# Patient Record
Sex: Female | Born: 1935 | Hispanic: No | Marital: Single
Health system: Southern US, Community
[De-identification: ages and names within clinical notes are randomized; demographics above are authoritative.]

## PROBLEM LIST (undated history)

## (undated) DIAGNOSIS — C3491 Malignant neoplasm of unspecified part of right bronchus or lung: Secondary | ICD-10-CM

## (undated) DIAGNOSIS — I2699 Other pulmonary embolism without acute cor pulmonale: Secondary | ICD-10-CM

## (undated) DIAGNOSIS — Z7189 Other specified counseling: Principal | ICD-10-CM

## (undated) DIAGNOSIS — C7951 Secondary malignant neoplasm of bone: Secondary | ICD-10-CM

## (undated) HISTORY — DX: Malignant neoplasm of unspecified part of right bronchus or lung: C34.91

## (undated) HISTORY — DX: Secondary malignant neoplasm of bone: C79.51

## (undated) HISTORY — DX: Other pulmonary embolism without acute cor pulmonale: I26.99

## (undated) HISTORY — DX: Other specified counseling: Z71.89

---

## 2001-07-29 ENCOUNTER — Other Ambulatory Visit: Admission: RE | Admit: 2001-07-29 | Discharge: 2001-07-29 | Payer: Self-pay | Admitting: Family Medicine

## 2016-12-14 DIAGNOSIS — J209 Acute bronchitis, unspecified: Secondary | ICD-10-CM | POA: Diagnosis not present

## 2016-12-14 DIAGNOSIS — J0100 Acute maxillary sinusitis, unspecified: Secondary | ICD-10-CM | POA: Diagnosis not present

## 2016-12-27 DIAGNOSIS — J0100 Acute maxillary sinusitis, unspecified: Secondary | ICD-10-CM | POA: Diagnosis not present

## 2017-01-07 DIAGNOSIS — J0100 Acute maxillary sinusitis, unspecified: Secondary | ICD-10-CM | POA: Diagnosis not present

## 2017-02-17 DIAGNOSIS — H2513 Age-related nuclear cataract, bilateral: Secondary | ICD-10-CM | POA: Diagnosis not present

## 2017-02-17 DIAGNOSIS — H524 Presbyopia: Secondary | ICD-10-CM | POA: Diagnosis not present

## 2017-09-14 DIAGNOSIS — Z139 Encounter for screening, unspecified: Secondary | ICD-10-CM | POA: Diagnosis not present

## 2017-09-14 DIAGNOSIS — Z9181 History of falling: Secondary | ICD-10-CM | POA: Diagnosis not present

## 2017-09-14 DIAGNOSIS — Z0000 Encounter for general adult medical examination without abnormal findings: Secondary | ICD-10-CM | POA: Diagnosis not present

## 2017-09-14 DIAGNOSIS — Z136 Encounter for screening for cardiovascular disorders: Secondary | ICD-10-CM | POA: Diagnosis not present

## 2017-09-14 DIAGNOSIS — Z1331 Encounter for screening for depression: Secondary | ICD-10-CM | POA: Diagnosis not present

## 2017-09-14 DIAGNOSIS — Z6829 Body mass index (BMI) 29.0-29.9, adult: Secondary | ICD-10-CM | POA: Diagnosis not present

## 2017-09-14 DIAGNOSIS — M8580 Other specified disorders of bone density and structure, unspecified site: Secondary | ICD-10-CM | POA: Diagnosis not present

## 2017-10-29 DIAGNOSIS — M4726 Other spondylosis with radiculopathy, lumbar region: Secondary | ICD-10-CM | POA: Diagnosis not present

## 2017-10-29 DIAGNOSIS — M5136 Other intervertebral disc degeneration, lumbar region: Secondary | ICD-10-CM | POA: Diagnosis not present

## 2017-10-29 DIAGNOSIS — M545 Low back pain: Secondary | ICD-10-CM | POA: Diagnosis not present

## 2017-11-04 DIAGNOSIS — R2689 Other abnormalities of gait and mobility: Secondary | ICD-10-CM | POA: Diagnosis not present

## 2017-11-04 DIAGNOSIS — M545 Low back pain: Secondary | ICD-10-CM | POA: Diagnosis not present

## 2017-11-04 DIAGNOSIS — M6281 Muscle weakness (generalized): Secondary | ICD-10-CM | POA: Diagnosis not present

## 2017-11-08 DIAGNOSIS — M545 Low back pain: Secondary | ICD-10-CM | POA: Diagnosis not present

## 2017-11-08 DIAGNOSIS — R2689 Other abnormalities of gait and mobility: Secondary | ICD-10-CM | POA: Diagnosis not present

## 2017-11-08 DIAGNOSIS — M6281 Muscle weakness (generalized): Secondary | ICD-10-CM | POA: Diagnosis not present

## 2017-11-11 DIAGNOSIS — R2689 Other abnormalities of gait and mobility: Secondary | ICD-10-CM | POA: Diagnosis not present

## 2017-11-11 DIAGNOSIS — M6281 Muscle weakness (generalized): Secondary | ICD-10-CM | POA: Diagnosis not present

## 2017-11-11 DIAGNOSIS — M545 Low back pain: Secondary | ICD-10-CM | POA: Diagnosis not present

## 2017-11-15 DIAGNOSIS — M545 Low back pain: Secondary | ICD-10-CM | POA: Diagnosis not present

## 2017-11-15 DIAGNOSIS — M6281 Muscle weakness (generalized): Secondary | ICD-10-CM | POA: Diagnosis not present

## 2017-11-15 DIAGNOSIS — R2689 Other abnormalities of gait and mobility: Secondary | ICD-10-CM | POA: Diagnosis not present

## 2017-11-23 DIAGNOSIS — M5441 Lumbago with sciatica, right side: Secondary | ICD-10-CM | POA: Diagnosis not present

## 2017-11-23 DIAGNOSIS — M545 Low back pain: Secondary | ICD-10-CM | POA: Diagnosis not present

## 2017-11-27 DIAGNOSIS — M48061 Spinal stenosis, lumbar region without neurogenic claudication: Secondary | ICD-10-CM | POA: Diagnosis not present

## 2017-11-27 DIAGNOSIS — M545 Low back pain: Secondary | ICD-10-CM | POA: Diagnosis not present

## 2017-11-27 DIAGNOSIS — M4807 Spinal stenosis, lumbosacral region: Secondary | ICD-10-CM | POA: Diagnosis not present

## 2017-11-27 DIAGNOSIS — R9389 Abnormal findings on diagnostic imaging of other specified body structures: Secondary | ICD-10-CM | POA: Diagnosis not present

## 2017-11-27 DIAGNOSIS — M5441 Lumbago with sciatica, right side: Secondary | ICD-10-CM | POA: Diagnosis not present

## 2017-12-02 ENCOUNTER — Ambulatory Visit (HOSPITAL_BASED_OUTPATIENT_CLINIC_OR_DEPARTMENT_OTHER)
Admission: RE | Admit: 2017-12-02 | Discharge: 2017-12-02 | Disposition: A | Discharge status: Home / Self Care | Payer: PPO | Source: Ambulatory Visit | Attending: Hematology & Oncology | Admitting: Hematology & Oncology

## 2017-12-02 ENCOUNTER — Inpatient Hospital Stay: Payer: PPO | Attending: Hematology & Oncology | Admitting: Hematology & Oncology

## 2017-12-02 ENCOUNTER — Encounter: Payer: Self-pay | Admitting: Hematology & Oncology

## 2017-12-02 ENCOUNTER — Other Ambulatory Visit: Payer: Self-pay | Admitting: Oncology

## 2017-12-02 ENCOUNTER — Inpatient Hospital Stay: Payer: PPO | Attending: Hematology & Oncology

## 2017-12-02 ENCOUNTER — Other Ambulatory Visit: Payer: Self-pay

## 2017-12-02 ENCOUNTER — Encounter (HOSPITAL_BASED_OUTPATIENT_CLINIC_OR_DEPARTMENT_OTHER): Payer: Self-pay | Admitting: Radiology

## 2017-12-02 VITALS — BP 169/73 | HR 71 | Temp 97.8°F | Resp 20 | Wt 180.5 lb

## 2017-12-02 DIAGNOSIS — L989 Disorder of the skin and subcutaneous tissue, unspecified: Secondary | ICD-10-CM | POA: Insufficient documentation

## 2017-12-02 DIAGNOSIS — Z7901 Long term (current) use of anticoagulants: Secondary | ICD-10-CM | POA: Insufficient documentation

## 2017-12-02 DIAGNOSIS — R918 Other nonspecific abnormal finding of lung field: Secondary | ICD-10-CM | POA: Insufficient documentation

## 2017-12-02 DIAGNOSIS — C801 Malignant (primary) neoplasm, unspecified: Secondary | ICD-10-CM | POA: Diagnosis not present

## 2017-12-02 DIAGNOSIS — R937 Abnormal findings on diagnostic imaging of other parts of musculoskeletal system: Secondary | ICD-10-CM | POA: Insufficient documentation

## 2017-12-02 DIAGNOSIS — C771 Secondary and unspecified malignant neoplasm of intrathoracic lymph nodes: Secondary | ICD-10-CM | POA: Diagnosis not present

## 2017-12-02 DIAGNOSIS — I2699 Other pulmonary embolism without acute cor pulmonale: Secondary | ICD-10-CM | POA: Insufficient documentation

## 2017-12-02 DIAGNOSIS — C7951 Secondary malignant neoplasm of bone: Secondary | ICD-10-CM

## 2017-12-02 DIAGNOSIS — C3490 Malignant neoplasm of unspecified part of unspecified bronchus or lung: Secondary | ICD-10-CM | POA: Diagnosis not present

## 2017-12-02 DIAGNOSIS — C3411 Malignant neoplasm of upper lobe, right bronchus or lung: Secondary | ICD-10-CM | POA: Insufficient documentation

## 2017-12-02 LAB — CBC WITH DIFFERENTIAL (CANCER CENTER ONLY)
Basophils Absolute: 0 10*3/uL (ref 0.0–0.1)
Basophils Relative: 0 %
EOS ABS: 0.3 10*3/uL (ref 0.0–0.5)
EOS PCT: 5 %
HCT: 37.5 % (ref 34.8–46.6)
Hemoglobin: 12.6 g/dL (ref 11.6–15.9)
LYMPHS ABS: 0.9 10*3/uL (ref 0.9–3.3)
LYMPHS PCT: 14 %
MCH: 29 pg (ref 26.0–34.0)
MCHC: 33.6 g/dL (ref 32.0–36.0)
MCV: 86.2 fL (ref 81.0–101.0)
MONO ABS: 0.5 10*3/uL (ref 0.1–0.9)
MONOS PCT: 7 %
Neutro Abs: 5 10*3/uL (ref 1.5–6.5)
Neutrophils Relative %: 74 %
PLATELETS: 268 10*3/uL (ref 145–400)
RBC: 4.35 MIL/uL (ref 3.70–5.32)
RDW: 12.4 % (ref 11.1–15.7)
WBC Count: 6.8 10*3/uL (ref 3.9–10.0)

## 2017-12-02 LAB — SAVE SMEAR

## 2017-12-02 LAB — TECHNOLOGIST SMEAR REVIEW

## 2017-12-02 LAB — CMP (CANCER CENTER ONLY)
ALT: 21 U/L (ref 10–47)
ANION GAP: 7 (ref 5–15)
AST: 23 U/L (ref 11–38)
Albumin: 4 g/dL (ref 3.5–5.0)
Alkaline Phosphatase: 76 U/L (ref 26–84)
BUN: 8 mg/dL (ref 7–22)
CHLORIDE: 98 mmol/L (ref 98–108)
CO2: 30 mmol/L (ref 18–33)
Calcium: 10.5 mg/dL — ABNORMAL HIGH (ref 8.0–10.3)
Creatinine: 0.7 mg/dL (ref 0.60–1.20)
GLUCOSE: 102 mg/dL (ref 73–118)
POTASSIUM: 4 mmol/L (ref 3.3–4.7)
Sodium: 135 mmol/L (ref 128–145)
TOTAL PROTEIN: 7.8 g/dL (ref 6.4–8.1)
Total Bilirubin: 0.6 mg/dL (ref 0.2–1.6)

## 2017-12-02 MED ORDER — IOPAMIDOL (ISOVUE-300) INJECTION 61%
100.0000 mL | Freq: Once | INTRAVENOUS | Status: AC | PRN
  Administered 2017-12-02: 100 mL via INTRAVENOUS

## 2017-12-02 MED ORDER — TRAMADOL HCL 50 MG PO TABS: 50.0000 mg | ORAL_TABLET | Freq: Four times a day (QID) | ORAL | 0 refills | Status: DC | PRN

## 2017-12-02 NOTE — Progress Notes (Signed)
I was contacted by radiology regarding the CT from today.  She appears to have primary lung cancer.  Acute pulmonary emboli were noted in the right lung.  I contacted Robin Sullivan.  She will go to the [facility] emergency room to receive a dose of Lovenox.  She will contact Dr. Marin Olp in the a.m. on 12/03/2017 for further instructions.  I spoke with Dr. Graylon Good at the [facility] emergency room.  He agrees to evaluate Robin Sullivan and begin anticoagulation therapy.

## 2017-12-02 NOTE — Progress Notes (Signed)
Referral MD  Reason for Referral: L5 infiltrative mass with right radicular pain  Chief Complaint  Patient presents with  . New Patient (Initial Visit)  : I had lower back pain for 2 months that is getting worse.  HPI: Robin Sullivan is a very nice 84 year old white female.  She certainly looks a lot younger.  She is a remote history of smoking.  She says she started at 84 years old and stopped when she was 84 years old.  She has never had surgery.  She is not sure when her last mammogram was.  She comes in with her daughter.  Her daughter says her last colonoscopy was about 12 years ago.  She said that she is always had back discomfort.  She says this happened when she was a child and had 5 brothers that she played with.  For the past couple months, her pain got worse in her lower back.  She denies any trauma.  She is had no change in bowel or bladder habits.  There is been no incontinence.  She had some pain down the right leg.  She saw Dr. Rip Harbour of [facility].  As always, he initiated a very thorough workup.  An MRI was subsequently done.  This was done on November 27, 2017.  Surprisingly, this showed masslike infiltration on the right at L5 with extension into the pedicle and posterior elements.  There is extraosseous extension of tumor with some encroachment of the right neural foramen at L5-S1.  Also noted was an area of infiltration on the right at L1 that measured 1.3 x 0.9 x 1.7 cm.  Everything else looked okay on the MRI.  Of note, the extra osseous component at L5 measures 4.5 x 3.3 cm.  She was then referred to the [facility] for an evaluation.  She is gained weight because she has not been able to exercise.  She has had no dysphasia.  She is had no cough.  She has had no bleeding.  She is had no fever or sweats.  He has had no palpable lymph glands.  Overall, I would say that her performance status is ECOG 1.  She has not had any blurred  vision.  She has had no nausea or vomiting.  She has had no swelling in her legs.  She is taking meloxicam for pain.  I am a little worried about her taking this with regard to her renal function and possible gastric irritation.  I will send in a prescription for tramadol (50-100 mg p.o. every 6 hours as needed)   No past medical history on file.:     Current Outpatient Medications:  .  meloxicam (MOBIC) 15 MG tablet, Take 15 mg by mouth daily., Disp: , Rfl:  .  traMADol (ULTRAM) 50 MG tablet, Take 1 tablet (50 mg total) by mouth every 6 (six) hours as needed., Disp: 120 tablet, Rfl: 0:  :  No Known Allergies:  No family history on file.:  Social History   Socioeconomic History  . Marital status: Unknown    Spouse name: Not on file  . Number of children: Not on file  . Years of education: Not on file  . Highest education level: Not on file  Social Needs  . Financial resource strain: Not on file  . Food insecurity - worry: Not on file  . Food insecurity - inability: Not on file  . Transportation needs - medical: Not on file  . Transportation  needs - non-medical: Not on file  Occupational History  . Not on file  Tobacco Use  . Smoking status: Not on file  Substance and Sexual Activity  . Alcohol use: Not on file  . Drug use: Not on file  . Sexual activity: Not on file  Other Topics Concern  . Not on file  Social History Narrative  . Not on file  :  Review of Systems  Constitutional: Negative.   HENT: Negative.   Eyes: Negative.   Respiratory: Negative.   Cardiovascular: Negative.   Gastrointestinal: Negative.   Genitourinary: Negative.   Musculoskeletal: Positive for back pain.  Skin: Negative.   Neurological: Negative.   Endo/Heme/Allergies: Negative.   Psychiatric/Behavioral: Negative.      Exam: Well-developed well-nourished white female in no obvious distress.  Vital signs show a temperature of 97.8.  Pulse 71.  Blood pressure 169/73.  Weight is 181  pounds.  Head neck exam shows no ocular or oral lesions.  She has no palpable cervical or supraclavicular lymph nodes.  Lungs are clear bilaterally.  Cardiac exam regular rate and rhythm with no murmurs, rubs or bruits.  Axillary exam shows no bilateral axillary adenopathy.  Abdomen is soft.  She is mildly obese.  She has good bowel sounds.  There is no fluid wave.  There is no palpable liver or spleen tip.  Back exam shows no tenderness over the spine.  She has some slight discomfort with percussion at the lumbosacral spine.  No paravertebral muscle spasms were noted.  Extremities shows no clubbing, cyanosis or edema.  She has good range of motion of her joints.  Neurological exam shows no focal neurological deficits. @IPVITALS @   Recent Labs    12/02/17 1324  WBC 6.8  HCT 37.5  PLT 268   Recent Labs    12/02/17 1324  NA 135  K 4.0  CL 98  CO2 30  GLUCOSE 102  BUN 8  CREATININE 0.70  CALCIUM 10.5*    Blood smear review: None  Pathology: None    Assessment and Plan: Robin Sullivan is a very nice 84 year old white female.  She has this mass at L5.  I cannot find anything that is pointing to a primary..  She does have a suspicious skin lesion on her lower back.  It is in the center of her lower back.  Given that she has a remote history of tobacco use,  I think it is worthwhile to get a CT scan of her chest.  I also want to get a CT of her abdomen and pelvis.  May be, we can find a primary in this manner.  She clearly will need a PET scan.  She also will need a biopsy.  It sounds like this extraosseous mass down at L5 should be approachable.  I also worry about the possibility of kidney cancer.  This certainly could present with a metastasis.  Once we get a tissue diagnosis, I will then involve radiation oncology.  Since she lives in [place], we will have her treated close to home.  I spent about an hour with she and her daughter.  I reviewed the MRI that she had done.  I  reviewed the labs that we had gotten back.  She understands quite well what our plan is.  I forgot to mention that she will need Xgeva once we get a tissue diagnosis.  I suspect that we probably will get her back in a couple weeks.  By then,  we should know what we are dealing with, and how much is present.  I did not yet talked to her about prognosis or goals of care.  We really need to get a handle on the etiology of this malignancy.  I answered all their questions.  I spent over 50% of the 1 hour face-to-face with the patient and her daughter.

## 2017-12-03 ENCOUNTER — Inpatient Hospital Stay (HOSPITAL_BASED_OUTPATIENT_CLINIC_OR_DEPARTMENT_OTHER): Payer: PPO | Admitting: Hematology & Oncology

## 2017-12-03 ENCOUNTER — Other Ambulatory Visit: Payer: Self-pay | Admitting: *Deleted

## 2017-12-03 VITALS — BP 141/59 | HR 66 | Temp 97.9°F | Resp 16 | Wt 180.1 lb

## 2017-12-03 DIAGNOSIS — I2692 Saddle embolus of pulmonary artery without acute cor pulmonale: Secondary | ICD-10-CM

## 2017-12-03 DIAGNOSIS — Z7901 Long term (current) use of anticoagulants: Secondary | ICD-10-CM

## 2017-12-03 DIAGNOSIS — C7951 Secondary malignant neoplasm of bone: Secondary | ICD-10-CM

## 2017-12-03 DIAGNOSIS — C3411 Malignant neoplasm of upper lobe, right bronchus or lung: Secondary | ICD-10-CM | POA: Diagnosis not present

## 2017-12-03 DIAGNOSIS — I2699 Other pulmonary embolism without acute cor pulmonale: Secondary | ICD-10-CM

## 2017-12-03 LAB — IGG, IGA, IGM
IGA: 139 mg/dL (ref 64–422)
IGM (IMMUNOGLOBULIN M), SRM: 68 mg/dL (ref 26–217)
IgG (Immunoglobin G), Serum: 1029 mg/dL (ref 700–1600)

## 2017-12-03 LAB — KAPPA/LAMBDA LIGHT CHAINS
KAPPA, LAMDA LIGHT CHAIN RATIO: 0.98 (ref 0.26–1.65)
Kappa free light chain: 8.2 mg/L (ref 3.3–19.4)
LAMDA FREE LIGHT CHAINS: 8.4 mg/L (ref 5.7–26.3)

## 2017-12-03 LAB — CEA (IN HOUSE-CHCC): CEA (CHCC-In House): 1.2 ng/mL (ref 0.00–5.00)

## 2017-12-03 LAB — CANCER ANTIGEN 27.29: CAN 27.29: 22.9 U/mL (ref 0.0–38.6)

## 2017-12-03 MED ORDER — RIVAROXABAN (XARELTO) VTE STARTER PACK (15 & 20 MG): ORAL_TABLET | ORAL | 0 refills | Status: DC

## 2017-12-03 NOTE — Progress Notes (Signed)
Hematology and Oncology Follow Up Visit  Robin Sullivan [id number] [birth year] 84 y.o. 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 daily-start on 12/03/2017     Interim History:  Robin Sullivan is back for a visit.  Unfortunately, we did a CT scan on her yesterday.  This was done to see if we can find out where this L5 lesion started.  We did find out where it started.  She has a large mass in the right upper lung.  This is centrally located.  She has bilateral pulmonary nodules.  However, we also found out that she had a pulmonary embolism.  She was seen at the ER at [facility].  She got a dose of Lovenox.  She now is coming back to see me so we can get her on formal anticoagulation.  On the CT scan, there is a 4.5 x 3.3 cm medial right upper lobe mass.  It invades the mediastinum with extension to the surface of the T5 vertebral body.  There are 8 pulmonary nodules.  Largest measures 12 mm in the right lower lobe.  She has some adenopathy in the right hilum.  CT of the abdomen and pelvis showed a 6.3 x 3.5 cm bone mass centered in the right L5 transverse process with extension into the pedicle, vertebral body and facet.  There is no adenopathy in the abdomen or pelvis.  She had no liver metastases.  I think we still need to get her biopsy of the L5 mass.  I think the lung masses to surface is located to safely biopsy.  She started tramadol yesterday.  This is making her feel a little bit better.  Overall, her performance status is ECOG 1.  Medications:  Current Outpatient Medications:  .  meloxicam (MOBIC) 15 MG tablet, Take 15 mg by mouth daily., Disp: , Rfl:  .  Rivaroxaban 15 & 20 MG TBPK, Take as directed on package: Start with one 15mg  tablet by mouth twice a day with food. On Day 22, switch to one 20mg  tablet once a day with food.,  Disp: 51 each, Rfl: 0 .  traMADol (ULTRAM) 50 MG tablet, Take 1 tablet (50 mg total) by mouth every 6 (six) hours as needed., Disp: 120 tablet, Rfl: 0  Allergies: No Known Allergies  Past Medical History, Surgical history, Social history, and Family History were reviewed and updated.  Review of Systems: Review of Systems  Constitutional: Positive for fatigue.  HENT:  Negative.   Eyes: Negative.   Respiratory: Positive for cough.   Endocrine: Negative.   Genitourinary: Negative.    Musculoskeletal: Positive for back pain.  Skin: Negative.   Neurological: Negative.   Hematological: Negative.   Psychiatric/Behavioral: Negative.     Physical Exam:  weight is 180 lb 1.3 oz (81.7 kg). Her oral temperature is 97.9 F (36.6 C). Her blood pressure is 141/59 (abnormal) and her pulse is 66. Her respiration is 16 and oxygen saturation is 100%.   Wt Readings from Last 3 Encounters:  12/03/17 180 lb 1.3 oz (81.7 kg)  12/02/17 180 lb 8 oz (81.9 kg)    Physical Exam  Constitutional: She is oriented to person, place, and time.  HENT:  Head: Normocephalic and atraumatic.  Mouth/Throat: Oropharynx is clear and moist.  Eyes: EOM are normal. Pupils are equal, round, and reactive to  light.  Neck: Normal range of motion.  Cardiovascular: Normal rate, regular rhythm and normal heart sounds.  Pulmonary/Chest: Effort normal and breath sounds normal.  Abdominal: Soft. Bowel sounds are normal.  Musculoskeletal: Normal range of motion. She exhibits no edema, tenderness or deformity.  Lymphadenopathy:    She has no cervical adenopathy.  Neurological: She is alert and oriented to person, place, and time.  Skin: Skin is warm and dry. No rash noted. No erythema.  Psychiatric: She has a normal mood and affect. Her behavior is normal. Judgment and thought content normal.  Vitals reviewed.    Lab Results  Component Value Date   WBC 6.8 12/02/2017   HCT 37.5 12/02/2017   MCV 86.2 12/02/2017   PLT  268 12/02/2017     Chemistry      Component Value Date/Time   NA 135 12/02/2017 1324   K 4.0 12/02/2017 1324   CL 98 12/02/2017 1324   CO2 30 12/02/2017 1324   BUN 8 12/02/2017 1324   CREATININE 0.70 12/02/2017 1324      Component Value Date/Time   CALCIUM 10.5 (H) 12/02/2017 1324   ALKPHOS 76 12/02/2017 1324   AST 23 12/02/2017 1324   ALT 21 12/02/2017 1324   BILITOT 0.6 12/02/2017 1324         Impression and Plan: Robin Sullivan is an 84 year old white female with metastatic bronchogenic carcinoma.  I am more worried right now about the pulmonary embolism.  I will send in the Xarelto starter pack to her pharmacy.  I told her how to take the Xarelto.  We still need to get the biopsy.  Hopefully this will be done next week.  Still need to get a PET scan on her.  I will have to see about getting a Doppler of her legs to see if there are any blood clots in her legs.  I spent about 35 minutes with she and her daughter.  Over 50% of the time was spent face-to-face reviewing the CT scans and going over my recommendations.  Robin Sullivan clearly understands that what she has is stage IV and that this is treatable but not curable.  As such, her goal of care is her quality of life.  She wanted to know how long I thought she had.  I told her that if she was still here a year from now, that I would be happy with this.  We will plan to get her back once we have the results and from her biopsy in her PET scan   Volanda Napoleon, MD 2/15/20192:20 PM

## 2017-12-06 LAB — PROTEIN ELECTROPHORESIS, SERUM, WITH REFLEX
A/G Ratio: 1.2 (ref 0.7–1.7)
ALPHA-2-GLOBULIN: 1 g/dL (ref 0.4–1.0)
Albumin ELP: 3.9 g/dL (ref 2.9–4.4)
Alpha-1-Globulin: 0.3 g/dL (ref 0.0–0.4)
Beta Globulin: 1.2 g/dL (ref 0.7–1.3)
GLOBULIN, TOTAL: 3.3 g/dL (ref 2.2–3.9)
Gamma Globulin: 0.9 g/dL (ref 0.4–1.8)
TOTAL PROTEIN ELP: 7.2 g/dL (ref 6.0–8.5)

## 2017-12-09 ENCOUNTER — Ambulatory Visit (HOSPITAL_COMMUNITY): Admission: RE | Admit: 2017-12-09 | Payer: PPO | Source: Ambulatory Visit

## 2017-12-10 ENCOUNTER — Encounter (HOSPITAL_COMMUNITY): Payer: Self-pay

## 2017-12-10 ENCOUNTER — Ambulatory Visit (HOSPITAL_COMMUNITY)
Admission: RE | Admit: 2017-12-10 | Discharge: 2017-12-10 | Disposition: A | Discharge status: Home / Self Care | Payer: PPO | Source: Ambulatory Visit | Attending: Hematology & Oncology | Admitting: Hematology & Oncology

## 2017-12-10 DIAGNOSIS — R918 Other nonspecific abnormal finding of lung field: Secondary | ICD-10-CM | POA: Insufficient documentation

## 2017-12-10 DIAGNOSIS — I700 Atherosclerosis of aorta: Secondary | ICD-10-CM | POA: Diagnosis not present

## 2017-12-10 DIAGNOSIS — R937 Abnormal findings on diagnostic imaging of other parts of musculoskeletal system: Secondary | ICD-10-CM | POA: Insufficient documentation

## 2017-12-10 DIAGNOSIS — I517 Cardiomegaly: Secondary | ICD-10-CM | POA: Diagnosis not present

## 2017-12-10 LAB — GLUCOSE, CAPILLARY: GLUCOSE-CAPILLARY: 93 mg/dL (ref 65–99)

## 2017-12-10 MED ORDER — FLUDEOXYGLUCOSE F - 18 (FDG) INJECTION
9.3000 | Freq: Once | INTRAVENOUS | Status: AC
  Administered 2017-12-10: 9.3 via INTRAVENOUS

## 2017-12-12 ENCOUNTER — Other Ambulatory Visit: Payer: Self-pay | Admitting: Radiology

## 2017-12-13 ENCOUNTER — Encounter (HOSPITAL_COMMUNITY): Payer: Self-pay

## 2017-12-13 ENCOUNTER — Ambulatory Visit (HOSPITAL_COMMUNITY)
Admission: RE | Admit: 2017-12-13 | Discharge: 2017-12-13 | Disposition: A | Discharge status: Home / Self Care | Payer: PPO | Source: Ambulatory Visit | Attending: Hematology & Oncology | Admitting: Hematology & Oncology

## 2017-12-13 DIAGNOSIS — M79604 Pain in right leg: Secondary | ICD-10-CM | POA: Diagnosis not present

## 2017-12-13 DIAGNOSIS — Z7901 Long term (current) use of anticoagulants: Secondary | ICD-10-CM | POA: Diagnosis not present

## 2017-12-13 DIAGNOSIS — Z86711 Personal history of pulmonary embolism: Secondary | ICD-10-CM | POA: Diagnosis not present

## 2017-12-13 DIAGNOSIS — C801 Malignant (primary) neoplasm, unspecified: Secondary | ICD-10-CM | POA: Insufficient documentation

## 2017-12-13 DIAGNOSIS — R590 Localized enlarged lymph nodes: Secondary | ICD-10-CM | POA: Diagnosis not present

## 2017-12-13 DIAGNOSIS — R222 Localized swelling, mass and lump, trunk: Secondary | ICD-10-CM | POA: Diagnosis not present

## 2017-12-13 DIAGNOSIS — M545 Low back pain: Secondary | ICD-10-CM | POA: Diagnosis not present

## 2017-12-13 DIAGNOSIS — R918 Other nonspecific abnormal finding of lung field: Secondary | ICD-10-CM | POA: Insufficient documentation

## 2017-12-13 DIAGNOSIS — Z87891 Personal history of nicotine dependence: Secondary | ICD-10-CM | POA: Insufficient documentation

## 2017-12-13 DIAGNOSIS — C7951 Secondary malignant neoplasm of bone: Secondary | ICD-10-CM | POA: Insufficient documentation

## 2017-12-13 DIAGNOSIS — R937 Abnormal findings on diagnostic imaging of other parts of musculoskeletal system: Secondary | ICD-10-CM | POA: Diagnosis present

## 2017-12-13 DIAGNOSIS — M899 Disorder of bone, unspecified: Secondary | ICD-10-CM | POA: Diagnosis not present

## 2017-12-13 LAB — PROTIME-INR
INR: 1.01
PROTHROMBIN TIME: 13.2 s (ref 11.4–15.2)

## 2017-12-13 LAB — CBC WITH DIFFERENTIAL/PLATELET
Basophils Absolute: 0 10*3/uL (ref 0.0–0.1)
Basophils Relative: 0 %
EOS ABS: 0.3 10*3/uL (ref 0.0–0.7)
Eosinophils Relative: 6 %
HCT: 32.7 % — ABNORMAL LOW (ref 36.0–46.0)
HEMOGLOBIN: 10.6 g/dL — AB (ref 12.0–15.0)
LYMPHS ABS: 0.8 10*3/uL (ref 0.7–4.0)
Lymphocytes Relative: 14 %
MCH: 27.5 pg (ref 26.0–34.0)
MCHC: 32.4 g/dL (ref 30.0–36.0)
MCV: 84.9 fL (ref 78.0–100.0)
Monocytes Absolute: 0.4 10*3/uL (ref 0.1–1.0)
Monocytes Relative: 7 %
NEUTROS PCT: 73 %
Neutro Abs: 3.9 10*3/uL (ref 1.7–7.7)
Platelets: 244 10*3/uL (ref 150–400)
RBC: 3.85 MIL/uL — AB (ref 3.87–5.11)
RDW: 12.6 % (ref 11.5–15.5)
WBC: 5.3 10*3/uL (ref 4.0–10.5)

## 2017-12-13 MED ORDER — FENTANYL CITRATE (PF) 100 MCG/2ML IJ SOLN
INTRAMUSCULAR | Status: AC | PRN
  Administered 2017-12-13: 50 ug via INTRAVENOUS
  Administered 2017-12-13: 25 ug via INTRAVENOUS

## 2017-12-13 MED ORDER — MIDAZOLAM HCL 2 MG/2ML IJ SOLN
INTRAMUSCULAR | Status: AC | PRN
  Administered 2017-12-13: 0.5 mg via INTRAVENOUS
  Administered 2017-12-13: 1 mg via INTRAVENOUS

## 2017-12-13 MED ORDER — SODIUM CHLORIDE 0.9 % IV SOLN: INTRAVENOUS | Status: DC

## 2017-12-13 MED ORDER — NALOXONE HCL 0.4 MG/ML IJ SOLN
INTRAMUSCULAR | Status: AC
  Filled 2017-12-13: qty 1

## 2017-12-13 MED ORDER — FLUMAZENIL 0.5 MG/5ML IV SOLN
INTRAVENOUS | Status: AC
  Filled 2017-12-13: qty 5

## 2017-12-13 MED ORDER — MIDAZOLAM HCL 2 MG/2ML IJ SOLN
INTRAMUSCULAR | Status: AC
  Filled 2017-12-13: qty 4

## 2017-12-13 MED ORDER — FENTANYL CITRATE (PF) 100 MCG/2ML IJ SOLN
INTRAMUSCULAR | Status: AC
  Filled 2017-12-13: qty 4

## 2017-12-13 MED ORDER — LIDOCAINE HCL 1 % IJ SOLN
INTRAMUSCULAR | Status: AC | PRN
  Administered 2017-12-13: 10 mL via INTRADERMAL

## 2017-12-13 NOTE — Procedures (Signed)
Pre procedural Dx: Right L5 paraspinal mass Post procedural Dx: Same  Technically successful CT guided biopsy of indeterminate Right L5 paraspinal mass   EBL: None.   Complications: None immediate.   Ronny Bacon, MD Pager #: [phone]

## 2017-12-13 NOTE — Consult Note (Addendum)
Chief Complaint: Patient was seen in consultation today for CT-guided L5 paraspinal mass biopsy  Referring Physician(s): Ennever,Peter R  Supervising Physician: Sandi Mariscal  Patient Status: [facility] - Out-pt  History of Present Illness: Robin Sullivan is a 84 y.o. female , prior smoker, with recent PET scan revealing hypermetabolic right upper lobe mass with hypermetabolic right paratracheal and right hilar adenopathy, hypermetabolic destructive lesion of right L5 vertebra as well as hypermetabolic nodularity in the right lower lobe lung.  Patient has also had recent right lower lobe PE, now on xarelto.  She presents today for image guided L5 paravertebral mass biopsy for further evaluation.  No past medical history on file.  Allergies: Patient has no known allergies.  Medications: Prior to Admission medications   Medication Sig Start Date End Date Taking? Authorizing Provider  meloxicam (MOBIC) 15 MG tablet Take 15 mg by mouth daily.    [provider]  Rivaroxaban 15 & 20 MG TBPK Take as directed on package: Start with one 15mg  tablet by mouth twice daily. Day 22, switch to one 20mg  tablet once daily. Take with food. 12/03/17   Volanda Napoleon, MD  traMADol (ULTRAM) 50 MG tablet Take 1 tablet (50 mg total) by mouth every 6 (six) hours as needed. 12/02/17   Volanda Napoleon, MD     No family history on file.  Social History   Socioeconomic History  . Marital status: Single    Spouse name: Not on file  . Number of children: Not on file  . Years of education: Not on file  . Highest education level: Not on file  Social Needs  . Financial resource strain: Not on file  . Food insecurity - worry: Not on file  . Food insecurity - inability: Not on file  . Transportation needs - medical: Not on file  . Transportation needs - non-medical: Not on file  Occupational History  . Not on file  Tobacco Use  . Smoking status: Not on file  Substance and Sexual Activity  .  Alcohol use: Not on file  . Drug use: Not on file  . Sexual activity: Not on file  Other Topics Concern  . Not on file  Social History Narrative  . Not on file     Review of Systems she denies fever, chest pain, dyspnea, cough, abdominal pain, nausea, vomiting or bleeding.  She does have occasional headaches, low back pain, and right leg discomfort.  Vital Signs: Blood pressure 157/81, heart rate 74, respirations 16, O2 sat 100% room air   Physical Exam awake, alert.  Chest clear to auscultation bilaterally.  Heart with regular rate and rhythm.  Abdomen soft, positive bowel sounds, nontender.  Asymmetrical enlargement of right greater than left lower extremity  Imaging: Ct Chest W Contrast  Result Date: 12/02/2017 CLINICAL DATA:  Suspected bone metastasis in the L5 lumbar spine on recent lumbar spine MRI performed for low back pain. Remote history of smoking. EXAM: CT CHEST, ABDOMEN, AND PELVIS WITH CONTRAST TECHNIQUE: Multidetector CT imaging of the chest, abdomen and pelvis was performed following the standard protocol during bolus administration of intravenous contrast. CONTRAST:  191mL ISOVUE-300 IOPAMIDOL (ISOVUE-300) INJECTION 61% COMPARISON:  11/27/2017 MRI lumbar spine. FINDINGS: CT CHEST FINDINGS Cardiovascular: Normal heart size. No significant pericardial fluid/thickening. Mildly atherosclerotic nonaneurysmal thoracic aorta. Normal caliber pulmonary arteries. There are acute/subacute pulmonary emboli within the segmental and subsegmental right lower lobe pulmonary artery branches. Mediastinum/Nodes: Subcentimeter hypodense posterior upper left thyroid lobe nodules. Unremarkable  esophagus. No axillary adenopathy. Enlarged heterogeneous 2.0 cm right paratracheal node (series 2/image 20). Enlarged 1.3 cm right hilar node (series 2/image 25). No left hilar adenopathy. Lungs/Pleura: No pneumothorax. No pleural effusion. Irregular solid 4.5 x 3.3 cm medial right upper lobe lung mass (series  2/image 18), which directly invades the mediastinum with extension to the surface of the right anterior T5 vertebral body without discrete osseous erosions. There are at least 8 scattered solid pulmonary nodules in the mid to lower right lung, largest 1.2 cm in the medial basilar right lower lobe (series 4/image 110). Ground-glass 8 mm left upper lobe pulmonary nodule (series 4/image 53). Musculoskeletal: No aggressive appearing focal osseous lesions. Mild thoracic spondylosis. CT ABDOMEN PELVIS FINDINGS Hepatobiliary: Normal liver with no liver mass. Normal gallbladder with no radiopaque cholelithiasis. No biliary ductal dilatation. Pancreas: Normal, with no mass or duct dilation. Spleen: Normal size. No mass. Adrenals/Urinary Tract: Normal adrenals. Normal kidneys with no hydronephrosis and no renal mass. Normal bladder. Stomach/Bowel: Normal non-distended stomach. Normal caliber small bowel with no small bowel wall thickening. Normal appendix. Normal large bowel with no diverticulosis, large bowel wall thickening or pericolonic fat stranding. Vascular/Lymphatic: Atherosclerotic nonaneurysmal abdominal aorta. Patent portal, splenic, hepatic and renal veins. No pathologically enlarged lymph nodes in the abdomen or pelvis. Reproductive: Grossly normal uterus.  No adnexal mass. Other: No pneumoperitoneum, ascites or focal fluid collection. Musculoskeletal: There is an expansile lytic 6.3 x 3.5 cm bone mass centered in right L5 transverse process with extension into the pedicle, right vertebral body and facet. Mild lumbar spondylosis. IMPRESSION: 1. Acute/subacute right lower lobe segmental/subsegmental pulmonary emboli. 2. Irregular 4.5 cm medial right upper lobe lung mass with mediastinal invasion, compatible with primary bronchogenic carcinoma. 3. Ipsilateral hilar and ipsilateral paratracheal nodal metastases. 4. Several solid pulmonary nodules scattered in the right lung up to 1.2 cm, probably ipsilateral  pulmonary metastases. 5. Expansile lytic bone metastasis centered in the right L5 transverse process with involvement of the posterior elements and right L5 vertebral body, as detailed on the recent lumbar spine MRI study. Critical Value/emergent results were called by telephone at the time of interpretation on 12/02/2017 at 5:51 pm to Dr. Julieanne Manson, who verbally acknowledged these results. Electronically Signed   By: Ilona Sorrel M.D.   On: 12/02/2017 17:58   Ct Abdomen Pelvis W Contrast  Result Date: 12/02/2017 CLINICAL DATA:  Suspected bone metastasis in the L5 lumbar spine on recent lumbar spine MRI performed for low back pain. Remote history of smoking. EXAM: CT CHEST, ABDOMEN, AND PELVIS WITH CONTRAST TECHNIQUE: Multidetector CT imaging of the chest, abdomen and pelvis was performed following the standard protocol during bolus administration of intravenous contrast. CONTRAST:  111mL ISOVUE-300 IOPAMIDOL (ISOVUE-300) INJECTION 61% COMPARISON:  11/27/2017 MRI lumbar spine. FINDINGS: CT CHEST FINDINGS Cardiovascular: Normal heart size. No significant pericardial fluid/thickening. Mildly atherosclerotic nonaneurysmal thoracic aorta. Normal caliber pulmonary arteries. There are acute/subacute pulmonary emboli within the segmental and subsegmental right lower lobe pulmonary artery branches. Mediastinum/Nodes: Subcentimeter hypodense posterior upper left thyroid lobe nodules. Unremarkable esophagus. No axillary adenopathy. Enlarged heterogeneous 2.0 cm right paratracheal node (series 2/image 20). Enlarged 1.3 cm right hilar node (series 2/image 25). No left hilar adenopathy. Lungs/Pleura: No pneumothorax. No pleural effusion. Irregular solid 4.5 x 3.3 cm medial right upper lobe lung mass (series 2/image 18), which directly invades the mediastinum with extension to the surface of the right anterior T5 vertebral body without discrete osseous erosions. There are at least 8 scattered solid pulmonary nodules in  the  mid to lower right lung, largest 1.2 cm in the medial basilar right lower lobe (series 4/image 110). Ground-glass 8 mm left upper lobe pulmonary nodule (series 4/image 53). Musculoskeletal: No aggressive appearing focal osseous lesions. Mild thoracic spondylosis. CT ABDOMEN PELVIS FINDINGS Hepatobiliary: Normal liver with no liver mass. Normal gallbladder with no radiopaque cholelithiasis. No biliary ductal dilatation. Pancreas: Normal, with no mass or duct dilation. Spleen: Normal size. No mass. Adrenals/Urinary Tract: Normal adrenals. Normal kidneys with no hydronephrosis and no renal mass. Normal bladder. Stomach/Bowel: Normal non-distended stomach. Normal caliber small bowel with no small bowel wall thickening. Normal appendix. Normal large bowel with no diverticulosis, large bowel wall thickening or pericolonic fat stranding. Vascular/Lymphatic: Atherosclerotic nonaneurysmal abdominal aorta. Patent portal, splenic, hepatic and renal veins. No pathologically enlarged lymph nodes in the abdomen or pelvis. Reproductive: Grossly normal uterus.  No adnexal mass. Other: No pneumoperitoneum, ascites or focal fluid collection. Musculoskeletal: There is an expansile lytic 6.3 x 3.5 cm bone mass centered in right L5 transverse process with extension into the pedicle, right vertebral body and facet. Mild lumbar spondylosis. IMPRESSION: 1. Acute/subacute right lower lobe segmental/subsegmental pulmonary emboli. 2. Irregular 4.5 cm medial right upper lobe lung mass with mediastinal invasion, compatible with primary bronchogenic carcinoma. 3. Ipsilateral hilar and ipsilateral paratracheal nodal metastases. 4. Several solid pulmonary nodules scattered in the right lung up to 1.2 cm, probably ipsilateral pulmonary metastases. 5. Expansile lytic bone metastasis centered in the right L5 transverse process with involvement of the posterior elements and right L5 vertebral body, as detailed on the recent lumbar spine MRI study.  Critical Value/emergent results were called by telephone at the time of interpretation on 12/02/2017 at 5:51 pm to Dr. Julieanne Manson, who verbally acknowledged these results. Electronically Signed   By: Ilona Sorrel M.D.   On: 12/02/2017 17:58   Nm Pet Image Initial (pi) Skull Base To Thigh  Result Date: 12/10/2017 CLINICAL DATA:  Initial treatment strategy for right upper lobe mass. EXAM: NUCLEAR MEDICINE PET SKULL BASE TO THIGH TECHNIQUE: 9.3 mCi F-18 FDG was injected intravenously. Full-ring PET imaging was performed from the skull base to thigh after the radiotracer. CT data was obtained and used for attenuation correction and anatomic localization. FASTING BLOOD GLUCOSE:  Value: 93 mg/dl COMPARISON:  CT scan 12/02/2017 FINDINGS: NECK No hypermetabolic lymph nodes in the neck. CHEST The right upper lobe mass measuring 4.7 by 3.3 cm on image 66/5 has a maximum SUV of 29.3 The adjacent right lower paratracheal node measuring 2.3 cm in short axis on image 67/4 has a maximum SUV of 21.4. A right hilar node measuring 1.3 cm in short axis on image 75/4 has a maximum SUV of 21.6. A right lower lobe nodule measuring 1.1 by 1.2 cm on image 92/4 has a maximum SUV of 4.6. A right lower paraesophageal lymph node near the hiatus measures 0.7 cm in short axis on image 91/4 and has a maximum standard uptake value of 4.2. Background mediastinal blood pool activity 2.9. Mild cardiomegaly. Mild atherosclerotic calcification of the aortic arch. The 7 mm in diameter right lower lobe pulmonary nodule on image 32/8 has a maximum SUV of 1.7. The other small nodules are not appreciably hypermetabolic. ABDOMEN/PELVIS No abnormal hypermetabolic activity within the liver, pancreas, adrenal glands, or spleen. No hypermetabolic lymph nodes in the abdomen or pelvis. Aortoiliac atherosclerotic vascular disease. Accentuated activity in the lower rectum with maximum SUV 9.8, without CT correlate, this may be physiologic but is technically  nonspecific.  SKELETON The destructive right eccentric L5 vertebral body mass involves the transverse process, and pedicle, with considerable extension into the surrounding paraspinal soft tissues and likely the L4-5 and L5-S1 neural foramina. The region of hypermetabolic activity has maximum SUV of 26.3 and measures approximately 7.1 by 3.9 cm. There is no abnormal marrow activity at L1 or elsewhere in the visualized skeleton. IMPRESSION: 1. Highly hypermetabolic right upper lobe mass with hypermetabolic right paratracheal and right hilar adenopathy; a highly hypermetabolic destructive lesion of the right L5 vertebra; and hypermetabolic nodularity in the right lower lobe overall compatible with metastatic right upper lobe lung cancer. 2. Accentuated activity in the lower rectum is probably physiologic given the lack of CT correlate, but may warrant digital rectal exam or alternative screening method such as Cologuard. 3.  Aortic Atherosclerosis (ICD10-I70.0). 4. Mild cardiomegaly. Electronically Signed   By: Van Clines M.D.   On: 12/10/2017 19:26    Labs:  CBC: Recent Labs    12/02/17 1324  WBC 6.8  HCT 37.5  PLT 268    COAGS: No results for input(s): INR, APTT in the last 8760 hours.  BMP: Recent Labs    12/02/17 1324  NA 135  K 4.0  CL 98  CO2 30  GLUCOSE 102  BUN 8  CALCIUM 10.5*  CREATININE 0.70    LIVER FUNCTION TESTS: Recent Labs    12/02/17 1324  BILITOT 0.6  AST 23  ALT 21  ALKPHOS 76  PROT 7.8  ALBUMIN 4.0    TUMOR MARKERS: No results for input(s): AFPTM, CEA, CA199, CHROMGRNA in the last 8760 hours.  Assessment and Plan: 84 y.o. female , prior smoker, with recent PET scan revealing hypermetabolic right upper lobe mass with hypermetabolic right paratracheal and right hilar adenopathy, hypermetabolic destructive lesion of right L5 vertebra as well as hypermetabolic nodularity in the right lower lobe lung.  Patient has also had recent right lower lobe PE,  now on xarelto.  She presents today for image guided L5 paravertebral mass biopsy for further evaluation.Risks and benefits discussed with the patient including, but not limited to bleeding, infection, damage to adjacent structures or low yield requiring additional tests. LE venous dopplers also to be ordered soon.  All of the patient's questions were answered, patient is agreeable to proceed. Consent signed and in chart.    Thank you for this interesting consult.  I greatly enjoyed meeting Robin Sullivan [facility] and look forward to participating in their care.  A copy of this report was sent to the requesting provider on this date.  Electronically Signed: D. Rowe Robert, PA-C 12/13/2017, 8:57 AM   I spent a total of 25 minutes  in face to face in clinical consultation, greater than 50% of which was counseling/coordinating care for CT-guided L5 paraspinal mass biopsy

## 2017-12-13 NOTE — Discharge Instructions (Signed)

## 2017-12-17 ENCOUNTER — Ambulatory Visit (HOSPITAL_BASED_OUTPATIENT_CLINIC_OR_DEPARTMENT_OTHER)
Admission: RE | Admit: 2017-12-17 | Discharge: 2017-12-17 | Disposition: A | Discharge status: Home / Self Care | Payer: PPO | Source: Ambulatory Visit | Attending: Hematology & Oncology | Admitting: Hematology & Oncology

## 2017-12-17 ENCOUNTER — Encounter: Payer: Self-pay | Admitting: Hematology & Oncology

## 2017-12-17 ENCOUNTER — Inpatient Hospital Stay: Payer: PPO | Attending: Hematology & Oncology | Admitting: Hematology & Oncology

## 2017-12-17 VITALS — BP 139/65 | HR 70 | Temp 98.0°F | Resp 19

## 2017-12-17 DIAGNOSIS — M549 Dorsalgia, unspecified: Secondary | ICD-10-CM | POA: Diagnosis not present

## 2017-12-17 DIAGNOSIS — M79604 Pain in right leg: Secondary | ICD-10-CM

## 2017-12-17 DIAGNOSIS — Z7901 Long term (current) use of anticoagulants: Secondary | ICD-10-CM | POA: Insufficient documentation

## 2017-12-17 DIAGNOSIS — I2699 Other pulmonary embolism without acute cor pulmonale: Secondary | ICD-10-CM | POA: Diagnosis not present

## 2017-12-17 DIAGNOSIS — M7989 Other specified soft tissue disorders: Secondary | ICD-10-CM | POA: Diagnosis not present

## 2017-12-17 DIAGNOSIS — C3411 Malignant neoplasm of upper lobe, right bronchus or lung: Secondary | ICD-10-CM

## 2017-12-17 DIAGNOSIS — C787 Secondary malignant neoplasm of liver and intrahepatic bile duct: Secondary | ICD-10-CM | POA: Diagnosis not present

## 2017-12-17 DIAGNOSIS — I2782 Chronic pulmonary embolism: Secondary | ICD-10-CM

## 2017-12-17 DIAGNOSIS — C3491 Malignant neoplasm of unspecified part of right bronchus or lung: Secondary | ICD-10-CM | POA: Insufficient documentation

## 2017-12-17 DIAGNOSIS — I2692 Saddle embolus of pulmonary artery without acute cor pulmonale: Secondary | ICD-10-CM | POA: Diagnosis not present

## 2017-12-17 DIAGNOSIS — C7951 Secondary malignant neoplasm of bone: Secondary | ICD-10-CM | POA: Insufficient documentation

## 2017-12-17 HISTORY — DX: Secondary malignant neoplasm of bone: C79.51

## 2017-12-17 HISTORY — DX: Malignant neoplasm of unspecified part of right bronchus or lung: C34.91

## 2017-12-17 HISTORY — DX: Other pulmonary embolism without acute cor pulmonale: I26.99

## 2017-12-17 MED ORDER — OXYCODONE HCL 5 MG PO TABS: ORAL_TABLET | ORAL | 0 refills | Status: DC

## 2017-12-17 MED ORDER — DEXAMETHASONE 4 MG PO TABS: ORAL_TABLET | ORAL | 3 refills | Status: AC

## 2017-12-17 MED ORDER — PANTOPRAZOLE SODIUM 40 MG PO TBEC: 40.0000 mg | DELAYED_RELEASE_TABLET | Freq: Two times a day (BID) | ORAL | 4 refills | Status: AC

## 2017-12-17 NOTE — Progress Notes (Signed)
Hematology and Oncology Follow Up Visit  Robin Sullivan [id number] [birth year] 84 y.o. 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 daily-start on 12/03/2017     Interim History:  Robin Sullivan is back for a visit.  We got the results back from her biopsy.  This was done on December 13, 2017.  This was a right L5 paraspinal mass.  The pathology report ([id number]) showed metastatic adenocarcinoma.  This was consistent with a lung primary.  We are awaiting the Foundation One results to see if there is a genetic mutation that we can target.  We did a PET scan on her.  A PET scan was done on February 28.  The PET scan showed hypermetabolic tumor in the right upper lobe.  This measured 4.7 x 3.3 cm.  It had an SUV of 29.  She had a right lower paratracheal lymph node measuring 2.3 cm.  It had an SUV of 21.4.  A right hilar lymph node measured 1.3 cm.  This is an SUV of 21.6.  She had a very highly metabolic tumor at L5.  This was a destructive process involving the L5 vertebral body, transverse process, pedicle.  This had an SUV of 26.3.  It measured 7.1 x 3.9 cm.  She also had a pulmonary embolism.  This was found during our evaluation.  She had a Doppler of her legs today.  The Dopplers were negative.  She had already been on Xarelto.  She is complaining more in the way of back and right leg pain.  I am sure that this is likely from her tumor.  I have spoken with radiation oncology down at [facility].  They will see Robin Sullivan early next week.  I put Robin Sullivan on oxycodone and Decadron.  I also gave her some Protonix.  She comes in with her daughter.  I told him that we have to await the results of the genetic analysis to see how we can treat her systemically.  Since she is symptomatic from the L5 tumor, this is where we need to focus our attention  right now.  She has had no bleeding.  There is no chest wall pain.  She has had no headache.  Overall, I say performance status is ECOG 1.   Medications:  Current Outpatient Medications:  .  dexamethasone (DECADRON) 4 MG tablet, Take 4 tablets today with food, then take 1 tablet 3 times a day with food, Disp: 120 tablet, Rfl: 3 .  oxyCODONE (OXY IR/ROXICODONE) 5 MG immediate release tablet, Take 1-2 pills, if needed, every 4 hours for pain, Disp: 90 tablet, Rfl: 0 .  pantoprazole (PROTONIX) 40 MG tablet, Take 1 tablet (40 mg total) by mouth 2 (two) times daily., Disp: 60 tablet, Rfl: 4 .  Rivaroxaban 15 & 20 MG TBPK, Take as directed on package: Start with one 15mg  tablet by mouth twice daily. Day 22, switch to one 20mg  tablet once daily. Take with food., Disp: 24 each, Rfl: 0  Allergies: No Known Allergies  Past Medical History, Surgical history, Social history, and Family History were reviewed and updated.  Review of Systems: Review of Systems  Constitutional: Positive for fatigue.  HENT:  Negative.   Eyes: Negative.   Respiratory: Positive for cough.   Endocrine: Negative.   Genitourinary: Negative.    Musculoskeletal: Positive  for back pain.  Skin: Negative.   Neurological: Negative.   Hematological: Negative.   Psychiatric/Behavioral: Negative.     Physical Exam:  oral temperature is 98 F (36.7 C). Her blood pressure is 139/65 and her pulse is 70. Her respiration is 19 and oxygen saturation is 96%.   Wt Readings from Last 3 Encounters:  12/03/17 180 lb 1.3 oz (81.7 kg)  12/02/17 180 lb 8 oz (81.9 kg)    Physical Exam  Constitutional: She is oriented to person, place, and time.  HENT:  Head: Normocephalic and atraumatic.  Mouth/Throat: Oropharynx is clear and moist.  Eyes: EOM are normal. Pupils are equal, round, and reactive to light.  Neck: Normal range of motion.  Cardiovascular: Normal rate, regular rhythm and normal heart sounds.  Pulmonary/Chest: Effort  normal and breath sounds normal.  Abdominal: Soft. Bowel sounds are normal.  Musculoskeletal: Normal range of motion. She exhibits no edema, tenderness or deformity.  Lymphadenopathy:    She has no cervical adenopathy.  Neurological: She is alert and oriented to person, place, and time.  Skin: Skin is warm and dry. No rash noted. No erythema.  Psychiatric: She has a normal mood and affect. Her behavior is normal. Judgment and thought content normal.  Vitals reviewed.    Lab Results  Component Value Date   WBC 5.3 12/13/2017   HGB 10.6 (L) 12/13/2017   HCT 32.7 (L) 12/13/2017   MCV 84.9 12/13/2017   PLT 244 12/13/2017     Chemistry      Component Value Date/Time   NA 135 12/02/2017 1324   K 4.0 12/02/2017 1324   CL 98 12/02/2017 1324   CO2 30 12/02/2017 1324   BUN 8 12/02/2017 1324   CREATININE 0.70 12/02/2017 1324      Component Value Date/Time   CALCIUM 10.5 (H) 12/02/2017 1324   ALKPHOS 76 12/02/2017 1324   AST 23 12/02/2017 1324   ALT 21 12/02/2017 1324   BILITOT 0.6 12/02/2017 1324         Impression and Plan: Robin Sullivan is an 84 year old white female with metastatic bronchogenic carcinoma.  This is an adenocarcinoma.  Hopefully, she will have a genetic mutation that we can target.  If not, we will have to consider systemic therapy with chemotherapy/immunotherapy.  Hopefully, the Foundation One analysis will be back next week.  I would like to think that the radiation therapy will help with the back pain.  She will continue the Xarelto.  I think she will be on Xarelto for long-term as far as I can tell right now.  She will definitely need Xgeva.  I will arrange for this to start when we start her systemic therapy.  I spent about 45 minutes with she and her daughter today.  Over 50% of the time was spent face-to-face discussing her pathology results, going over her PET scan results, and explaining my recommendations for systemic therapy and local therapy for  her back pain and right radicular leg pain.  We will plan to get her back in about 2 or 3 weeks.  Volanda Napoleon, MD 3/1/20195:29 PM

## 2017-12-21 DIAGNOSIS — C3490 Malignant neoplasm of unspecified part of unspecified bronchus or lung: Secondary | ICD-10-CM | POA: Diagnosis not present

## 2017-12-21 DIAGNOSIS — C7951 Secondary malignant neoplasm of bone: Secondary | ICD-10-CM | POA: Diagnosis not present

## 2017-12-22 DIAGNOSIS — C7951 Secondary malignant neoplasm of bone: Secondary | ICD-10-CM | POA: Diagnosis not present

## 2017-12-22 DIAGNOSIS — C801 Malignant (primary) neoplasm, unspecified: Secondary | ICD-10-CM | POA: Diagnosis not present

## 2017-12-22 DIAGNOSIS — Z510 Encounter for antineoplastic radiation therapy: Secondary | ICD-10-CM | POA: Diagnosis not present

## 2017-12-23 DIAGNOSIS — C3490 Malignant neoplasm of unspecified part of unspecified bronchus or lung: Secondary | ICD-10-CM | POA: Diagnosis not present

## 2017-12-23 DIAGNOSIS — C7951 Secondary malignant neoplasm of bone: Secondary | ICD-10-CM | POA: Diagnosis not present

## 2017-12-23 DIAGNOSIS — Z510 Encounter for antineoplastic radiation therapy: Secondary | ICD-10-CM | POA: Diagnosis not present

## 2017-12-24 DIAGNOSIS — C7951 Secondary malignant neoplasm of bone: Secondary | ICD-10-CM | POA: Diagnosis not present

## 2017-12-27 DIAGNOSIS — C7951 Secondary malignant neoplasm of bone: Secondary | ICD-10-CM | POA: Diagnosis not present

## 2017-12-28 DIAGNOSIS — C7951 Secondary malignant neoplasm of bone: Secondary | ICD-10-CM | POA: Diagnosis not present

## 2017-12-29 ENCOUNTER — Other Ambulatory Visit: Payer: Self-pay | Admitting: *Deleted

## 2017-12-29 DIAGNOSIS — I2782 Chronic pulmonary embolism: Secondary | ICD-10-CM

## 2017-12-29 DIAGNOSIS — C7951 Secondary malignant neoplasm of bone: Secondary | ICD-10-CM | POA: Diagnosis not present

## 2017-12-29 DIAGNOSIS — C3491 Malignant neoplasm of unspecified part of right bronchus or lung: Secondary | ICD-10-CM | POA: Diagnosis not present

## 2017-12-29 MED ORDER — RIVAROXABAN 20 MG PO TABS: 20.0000 mg | ORAL_TABLET | Freq: Every day | ORAL | 11 refills | Status: AC

## 2017-12-30 DIAGNOSIS — C7951 Secondary malignant neoplasm of bone: Secondary | ICD-10-CM | POA: Diagnosis not present

## 2017-12-31 DIAGNOSIS — C7951 Secondary malignant neoplasm of bone: Secondary | ICD-10-CM | POA: Diagnosis not present

## 2018-01-03 DIAGNOSIS — C7951 Secondary malignant neoplasm of bone: Secondary | ICD-10-CM | POA: Diagnosis not present

## 2018-01-04 ENCOUNTER — Encounter (HOSPITAL_COMMUNITY): Payer: Self-pay | Admitting: Hematology & Oncology

## 2018-01-04 ENCOUNTER — Other Ambulatory Visit: Payer: Self-pay | Admitting: Hematology & Oncology

## 2018-01-04 DIAGNOSIS — C7951 Secondary malignant neoplasm of bone: Secondary | ICD-10-CM | POA: Diagnosis not present

## 2018-01-05 DIAGNOSIS — C7951 Secondary malignant neoplasm of bone: Secondary | ICD-10-CM | POA: Diagnosis not present

## 2018-01-06 DIAGNOSIS — C7951 Secondary malignant neoplasm of bone: Secondary | ICD-10-CM | POA: Diagnosis not present

## 2018-01-07 DIAGNOSIS — C7951 Secondary malignant neoplasm of bone: Secondary | ICD-10-CM | POA: Diagnosis not present

## 2018-01-10 ENCOUNTER — Inpatient Hospital Stay: Payer: PPO

## 2018-01-10 ENCOUNTER — Inpatient Hospital Stay (HOSPITAL_BASED_OUTPATIENT_CLINIC_OR_DEPARTMENT_OTHER): Payer: PPO | Admitting: Hematology & Oncology

## 2018-01-10 ENCOUNTER — Encounter: Payer: Self-pay | Admitting: Hematology & Oncology

## 2018-01-10 DIAGNOSIS — Z7189 Other specified counseling: Secondary | ICD-10-CM

## 2018-01-10 DIAGNOSIS — C7951 Secondary malignant neoplasm of bone: Secondary | ICD-10-CM

## 2018-01-10 DIAGNOSIS — C3411 Malignant neoplasm of upper lobe, right bronchus or lung: Secondary | ICD-10-CM

## 2018-01-10 HISTORY — DX: Other specified counseling: Z71.89

## 2018-01-10 MED ORDER — DENOSUMAB 120 MG/1.7ML ~~LOC~~ SOLN: 120.0000 mg | Freq: Once | SUBCUTANEOUS | Status: AC

## 2018-01-10 MED ORDER — DENOSUMAB 120 MG/1.7ML ~~LOC~~ SOLN
SUBCUTANEOUS | Status: AC
  Filled 2018-01-10: qty 1.7

## 2018-01-10 NOTE — Progress Notes (Unsigned)
Today's Robin Sullivan is the patient's first dose. Okay to use baseline Ca = 10.5 and SCr 0.7 from 12/04/17 per Dr. Marin Olp.

## 2018-01-10 NOTE — Progress Notes (Signed)
Patient has not been taking her Xarelto for 2 days because she noticed some vaginal spotting. NO BM to have noticed any bleeding with and NO epistaxis.

## 2018-01-10 NOTE — Progress Notes (Signed)
Hematology and Oncology Follow Up Visit  Robin Sullivan [id number] [birth year] 84 y.o. 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 daily-start on 12/03/2017  Radiation Therapy to the lower back     Interim History:  Robin Sullivan is back for a visit.  We got the results back from her biopsy.  This was done on December 13, 2017.  This was a right L5 paraspinal mass.  The pathology report ([id number]) showed metastatic adenocarcinoma.  This was consistent with a lung primary.  We did get the [facility] molecular assay back.  Unfortunately, there really was not much that we could target.  She did not have any actionable mutations.  She was microsatellite stable.  She did have in intermediate TMB.  She was positive for KRAS mutation.  She currently is getting radiation therapy.  This is helping a little bit.  She is on Xarelto.  For some reason, she stopped the Xarelto a couple days ago.  I am not sure as to why she stopped this.  I told her that she really needed to get on the Xarelto so that her blood clots would not progress.  She comes in with her son and daughter-in-law.  I talked to them at length about the systemic treatment possibilities.  As I see it, I think the only option that we have systemically is to use chemo immunotherapy.  I think this would work.  I know that she is 84 years old but she still has a good performance status.  I gave her information sheets on carboplatinum/Alimta/pembrolizumab.  I think that this combination could have a success rate of about 70%.  She knows that she is not curable.  We have talked about this before.  Our goal here is quality of life.  I want her to be able to have better quality of life so she can do activities that she enjoys and be able to enjoy her family.  I really think that we can accomplish this.  We  talked about the possibility of vertebroplasty.  I will have to talk to the interventional radiology doctors to see if they think that the spine lesions are amenable to vertebroplasty.  If she does take chemotherapy, she will need to have a Port-A-Cath.  I spent about 45 minutes with Robin Sullivan and her family.  Overall, I say performance status is ECOG 1.   Medications:  Current Outpatient Medications:  .  furosemide (LASIX) 20 MG tablet, Take 20 mg by mouth., Disp: , Rfl:  .  methadone (DOLOPHINE) 10 MG tablet, Take 10 mg by mouth every 8 (eight) hours., Disp: , Rfl:  .  dexamethasone (DECADRON) 4 MG tablet, Take 4 tablets today with food, then take 1 tablet 3 times a day with food, Disp: 120 tablet, Rfl: 3 .  pantoprazole (PROTONIX) 40 MG tablet, Take 1 tablet (40 mg total) by mouth 2 (two) times daily., Disp: 60 tablet, Rfl: 4 .  rivaroxaban (XARELTO) 20 MG TABS tablet, Take 1 tablet (20 mg total) by mouth daily with supper., Disp: 30 tablet, Rfl: 11 No current facility-administered medications for this visit.   Facility-Administered Medications Ordered in Other Visits:  .  denosumab (XGEVA) injection 120 mg, 120 mg, Subcutaneous, Once, Yusuf Yu, Rudell Cobb, MD  Allergies: No Known Allergies  Past Medical History, Surgical history, Social history, and  Family History were reviewed and updated.  Review of Systems: Review of Systems  Constitutional: Positive for fatigue.  HENT:  Negative.   Eyes: Negative.   Respiratory: Positive for cough.   Endocrine: Negative.   Genitourinary: Negative.    Musculoskeletal: Positive for back pain.  Skin: Negative.   Neurological: Negative.   Hematological: Negative.   Psychiatric/Behavioral: Negative.     Physical Exam:  oral temperature is 98.4 F (36.9 C). Her blood pressure is 142/90 (abnormal) and her pulse is 80. Her respiration is 19 and oxygen saturation is 100%.   Wt Readings from Last 3 Encounters:  12/03/17 180 lb 1.3 oz (81.7 kg)    12/02/17 180 lb 8 oz (81.9 kg)    Physical Exam  Constitutional: She is oriented to person, place, and time.  HENT:  Head: Normocephalic and atraumatic.  Mouth/Throat: Oropharynx is clear and moist.  Eyes: Pupils are equal, round, and reactive to light. EOM are normal.  Neck: Normal range of motion.  Cardiovascular: Normal rate, regular rhythm and normal heart sounds.  Pulmonary/Chest: Effort normal and breath sounds normal.  Abdominal: Soft. Bowel sounds are normal.  Musculoskeletal: Normal range of motion. She exhibits no edema, tenderness or deformity.  Lymphadenopathy:    She has no cervical adenopathy.  Neurological: She is alert and oriented to person, place, and time.  Skin: Skin is warm and dry. No rash noted. No erythema.  Psychiatric: She has a normal mood and affect. Her behavior is normal. Judgment and thought content normal.  Vitals reviewed.    Lab Results  Component Value Date   WBC 5.3 12/13/2017   HGB 10.6 (L) 12/13/2017   HCT 32.7 (L) 12/13/2017   MCV 84.9 12/13/2017   PLT 244 12/13/2017     Chemistry      Component Value Date/Time   NA 135 12/02/2017 1324   K 4.0 12/02/2017 1324   CL 98 12/02/2017 1324   CO2 30 12/02/2017 1324   BUN 8 12/02/2017 1324   CREATININE 0.70 12/02/2017 1324      Component Value Date/Time   CALCIUM 10.5 (H) 12/02/2017 1324   ALKPHOS 76 12/02/2017 1324   AST 23 12/02/2017 1324   ALT 21 12/02/2017 1324   BILITOT 0.6 12/02/2017 1324         Impression and Plan: Robin Sullivan is an 84 year old white female with metastatic bronchogenic carcinoma.  This is an adenocarcinoma.  If we do do systemic chemotherapy with immunotherapy, I would do 3 cycles and then repeat her PET scan.  If we see a response, then I would do 3 more cycles.  If we still see a response, that I would consider her for maintenance therapy.  I suppose we could also consider using Avastin if necessary.  She will let us know what her decision is  regarding systemic therapy.  She wants to complete her radiation therapy first.  She wants to have the kyphoplasty.  Volanda Napoleon, MD 3/25/20196:02 PM

## 2018-01-11 DIAGNOSIS — C7951 Secondary malignant neoplasm of bone: Secondary | ICD-10-CM | POA: Diagnosis not present

## 2018-01-12 DIAGNOSIS — C7951 Secondary malignant neoplasm of bone: Secondary | ICD-10-CM | POA: Diagnosis not present

## 2018-01-26 DIAGNOSIS — C3490 Malignant neoplasm of unspecified part of unspecified bronchus or lung: Secondary | ICD-10-CM | POA: Diagnosis not present

## 2018-01-26 DIAGNOSIS — C7951 Secondary malignant neoplasm of bone: Secondary | ICD-10-CM | POA: Diagnosis not present

## 2018-02-07 ENCOUNTER — Inpatient Hospital Stay (HOSPITAL_BASED_OUTPATIENT_CLINIC_OR_DEPARTMENT_OTHER): Payer: PPO | Admitting: Family

## 2018-02-07 ENCOUNTER — Inpatient Hospital Stay: Payer: PPO | Attending: Hematology & Oncology

## 2018-02-07 ENCOUNTER — Other Ambulatory Visit: Payer: Self-pay

## 2018-02-07 VITALS — BP 152/77 | HR 79 | Temp 97.4°F | Resp 16 | Wt 169.0 lb

## 2018-02-07 DIAGNOSIS — C7951 Secondary malignant neoplasm of bone: Secondary | ICD-10-CM

## 2018-02-07 DIAGNOSIS — C3491 Malignant neoplasm of unspecified part of right bronchus or lung: Secondary | ICD-10-CM

## 2018-02-07 DIAGNOSIS — R600 Localized edema: Secondary | ICD-10-CM

## 2018-02-07 DIAGNOSIS — C3411 Malignant neoplasm of upper lobe, right bronchus or lung: Secondary | ICD-10-CM | POA: Insufficient documentation

## 2018-02-07 DIAGNOSIS — Z7901 Long term (current) use of anticoagulants: Secondary | ICD-10-CM

## 2018-02-07 DIAGNOSIS — I2699 Other pulmonary embolism without acute cor pulmonale: Secondary | ICD-10-CM

## 2018-02-07 DIAGNOSIS — C787 Secondary malignant neoplasm of liver and intrahepatic bile duct: Secondary | ICD-10-CM

## 2018-02-07 DIAGNOSIS — K5903 Drug induced constipation: Secondary | ICD-10-CM

## 2018-02-07 LAB — CBC WITH DIFFERENTIAL (CANCER CENTER ONLY)
BASOS ABS: 0 10*3/uL (ref 0.0–0.1)
BASOS PCT: 0 %
EOS ABS: 0 10*3/uL (ref 0.0–0.5)
EOS PCT: 0 %
HCT: 31.3 % — ABNORMAL LOW (ref 34.8–46.6)
Hemoglobin: 10.5 g/dL — ABNORMAL LOW (ref 11.6–15.9)
Lymphocytes Relative: 3 %
Lymphs Abs: 0.3 10*3/uL — ABNORMAL LOW (ref 0.9–3.3)
MCH: 28.9 pg (ref 26.0–34.0)
MCHC: 33.5 g/dL (ref 32.0–36.0)
MCV: 86.2 fL (ref 81.0–101.0)
Monocytes Absolute: 0.3 10*3/uL (ref 0.1–0.9)
Monocytes Relative: 3 %
Neutro Abs: 8.6 10*3/uL — ABNORMAL HIGH (ref 1.5–6.5)
Neutrophils Relative %: 94 %
PLATELETS: 181 10*3/uL (ref 145–400)
RBC: 3.63 MIL/uL — ABNORMAL LOW (ref 3.70–5.32)
RDW: 15.7 % (ref 11.1–15.7)
WBC: 9.3 10*3/uL (ref 3.9–10.0)

## 2018-02-07 LAB — CMP (CANCER CENTER ONLY)
ALK PHOS: 93 U/L — AB (ref 26–84)
ALT: 39 U/L (ref 10–47)
AST: 25 U/L (ref 11–38)
Albumin: 3.4 g/dL — ABNORMAL LOW (ref 3.5–5.0)
Anion gap: 4 — ABNORMAL LOW (ref 5–15)
BUN: 17 mg/dL (ref 7–22)
CALCIUM: 10.6 mg/dL — AB (ref 8.0–10.3)
CO2: 32 mmol/L (ref 18–33)
Chloride: 103 mmol/L (ref 98–108)
Creatinine: 0.6 mg/dL (ref 0.60–1.20)
GLUCOSE: 145 mg/dL — AB (ref 73–118)
Potassium: 4.9 mmol/L — ABNORMAL HIGH (ref 3.3–4.7)
SODIUM: 139 mmol/L (ref 128–145)
TOTAL PROTEIN: 6.5 g/dL (ref 6.4–8.1)
Total Bilirubin: 0.9 mg/dL (ref 0.2–1.6)

## 2018-02-07 MED ORDER — NALOXEGOL OXALATE 25 MG PO TABS: 25.0000 mg | ORAL_TABLET | Freq: Every day | ORAL | 1 refills | Status: AC

## 2018-02-07 NOTE — Progress Notes (Signed)
Hematology and Oncology Follow Up Visit  Robin Sullivan [id number] [birth year] 84 y.o. 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 daily-start on 12/03/2017 Radiation therapy to the lower back   Interim History:  Robin Sullivan is here today with Robin Sullivan Robin Sullivan for follow-up. Robin Sullivan has q great deal of swelling in Robin Sullivan Robin Sullivan lower extremities as well as weeping. No tenderness, numbness or tingling. Robin Sullivan has started taking lasix 20 mg PO daily with no improvement.  Albumin is 3.4, Hgb 10.5.  Robin Sullivan has had 4 falls at home but thankfully has not been seriously injured. Robin Sullivan uses Robin Sullivan walker when ambulating for support.  Robin Sullivan is feeling fatigued, weak and will have some SOB with over exertion.  Robin Sullivan has been taking Robin Sullivan Decadron 4 mg PO BID.  Robin Sullivan states that Robin Sullivan completed radiation almost 2 weeks ago.  Robin Sullivan is taking Methadone for lower back pain due to the right L5 paraspinal mass. Robin Sullivan notes that sitting is fine but Robin Sullivan experiences pain when standing or sitting.  Robin Sullivan is taking oxycodone for any breakthrough pain. The pain medication has caused significant constipation. Robin Sullivan states that Robin Sullivan has tried miralax, stool softeners and Lactulose with no improvement.  Robin Sullivan has GERD with nausea (no vomiting) but is drinking ginger beer (non-alcoholic) which helps.  No fever, chills, cough, rash, dizziness, chest pain, palpitations, abdominal pain or changes in bladder habits.  Robin Sullivan is doing well on Xarelto 20 mg PO daily for PE. No episodes of bleeding, no bruising or petechiae.  No lymphadenopathy noted on exam.  Robin Sullivan states that Robin Sullivan has a good appetite and is staying well hydrated. Robin Sullivan weight is down 11 lbs since February.   ECOG Performance Status: 2 - Symptomatic, <50% confined to bed  Medications:  Allergies as of 02/07/2018   No Known Allergies     Medication List        Accurate as of  02/07/18  8:05 PM. Always use your most recent med list.          dexamethasone 4 MG tablet Commonly known as:  DECADRON Take 4 tablets today with food, then take 1 tablet 3 times a day with food   furosemide 20 MG tablet Commonly known as:  LASIX Take 20 mg by mouth.   Melatonin 10 MG Tabs Take 10 mg by mouth every evening.   methadone 10 MG tablet Commonly known as:  DOLOPHINE Take 10 mg by mouth every 8 (eight) hours.   naloxegol oxalate 25 MG Tabs tablet Commonly known as:  MOVANTIK Take 1 tablet (25 mg total) by mouth daily.   oxyCODONE 5 MG immediate release tablet Commonly known as:  Oxy IR/ROXICODONE Take 5 mg by mouth 4 (four) times daily as needed.   pantoprazole 40 MG tablet Commonly known as:  PROTONIX Take 1 tablet (40 mg total) by mouth 2 (two) times daily.   rivaroxaban 20 MG Tabs tablet Commonly known as:  XARELTO Take 1 tablet (20 mg total) by mouth daily with supper.       Allergies: No Known Allergies  Past Medical History, Surgical history, Social history, and Family History were reviewed and updated.  Review of Systems: All other 10 point review of systems is negative.   Physical Exam:  weight is 169 lb (76.7 kg). Robin Sullivan oral temperature is 97.4 F (36.3 C) (abnormal). Robin Sullivan blood pressure is 152/77 (abnormal)  and Robin Sullivan pulse is 79. Robin Sullivan respiration is 16 and oxygen saturation is 100%.   Wt Readings from Last 3 Encounters:  02/07/18 169 lb (76.7 kg)  12/03/17 180 lb 1.3 oz (81.7 kg)  12/02/17 180 lb 8 oz (81.9 kg)    Ocular: Sclerae unicteric, pupils equal, round and reactive to light Ear-nose-throat: Oropharynx clear, dentition fair Lymphatic: No cervical, supraclavicular or axillary adenopathy Lungs no rales or rhonchi, good excursion bilaterally Heart regular rate and rhythm, no murmur appreciated Abd soft, nontender, positive bowel sounds, no liver or spleen tip palpated on exam, no fluid wave  MSK no focal spinal tenderness, no joint  edema Neuro: non-focal, well-oriented, appropriate affect Breasts: Deferred   Lab Results  Component Value Date   WBC 9.3 02/07/2018   HGB 10.5 (L) 02/07/2018   HCT 31.3 (L) 02/07/2018   MCV 86.2 02/07/2018   PLT 181 02/07/2018   No results found for: FERRITIN, IRON, TIBC, UIBC, IRONPCTSAT Lab Results  Component Value Date   RBC 3.63 (L) 02/07/2018   Lab Results  Component Value Date   KPAFRELGTCHN 8.2 12/02/2017   LAMBDASER 8.4 12/02/2017   KAPLAMBRATIO 0.98 12/02/2017   Lab Results  Component Value Date   IGGSERUM 1,029 12/02/2017   IGA 139 12/02/2017   IGMSERUM 68 12/02/2017   Lab Results  Component Value Date   TOTALPROTELP 7.2 12/02/2017   ALBUMINELP 3.9 12/02/2017   A1GS 0.3 12/02/2017   A2GS 1.0 12/02/2017   BETS 1.2 12/02/2017   GAMS 0.9 12/02/2017   MSPIKE Not Observed 12/02/2017     Chemistry      Component Value Date/Time   NA 139 02/07/2018 1443   K 4.9 (H) 02/07/2018 1443   CL 103 02/07/2018 1443   CO2 32 02/07/2018 1443   BUN 17 02/07/2018 1443   CREATININE 0.60 02/07/2018 1443      Component Value Date/Time   CALCIUM 10.6 (H) 02/07/2018 1443   ALKPHOS 93 (H) 02/07/2018 1443   AST 25 02/07/2018 1443   ALT 39 02/07/2018 1443   BILITOT 0.9 02/07/2018 1443      Impression and Plan: Robin Sullivan is a very pleasant 84 yo caucasian female with metastatic adenocarcinoma of the right upper lung with bone metastasis. Robin Sullivan is still considering whether or not Robin Sullivan would like to try systemic treatment. Robin Sullivan wants to have a good quality of life. Robin Sullivan would like to do some more research.  Robin Sullivan has a great deal of swelling in Robin Sullivan lower extremities. Robin Sullivan will try taking 40 mg PO daily.  We will also plan to get an MRI of the lumbar spine as well as repeat a PET scan to evaluate response to radiation and possible progression of disease.  We will also have Robin Sullivan try Movantik for Robin Sullivan constipation and see if this helps give Robin Sullivan some relief.  We will plan to see Robin Sullivan back  in another 3 weeks for follow-up.  They will contact our office with any questions or concerns. We can certainly see Robin Sullivan sooner if need be.   Laverna Peace, NP 4/22/20198:05 PM     ADDENDUM: I saw and examined the patient with Robin Sullivan.  I must say that Robin Sullivan has a lot of leg edema.  I am not sure Robin Sullivan is really taking Robin Sullivan Lasix.  I told Robin Sullivan to take 40 mg of Lasix a day.  Hopefully this will help with some of the edema in Robin Sullivan legs.  Robin Sullivan completed the radiation therapy to Robin Sullivan lower back.  Robin Sullivan is not a candidate for kyphoplasty as most of Robin Sullivan tumor is outside of the spine.  I think that would be worthwhile getting a PET scan on Robin Sullivan and doing another MRI of Robin Sullivan lumbar spine so we can see how Robin Sullivan cancer is progressing and has responded to radiation therapy.  Robin Sullivan is still not decided about chemotherapy.  I think that Robin Sullivan would respond well to chemo immunotherapy.  However, Robin Sullivan is more inclined to try holistic therapy.  I would like to see Robin Sullivan back in another 3 weeks.  By then, we will definitely have a better idea as to what type of response Robin Sullivan had to the radiation and whether Robin Sullivan systemic cancer is worsening.  Hopefully, Robin Sullivan leg edema will improve.  We spent about 45 minutes with Robin Sullivan and Robin Sullivan Robin Sullivan today.  Over half the time was spent face-to-face with them.  Lattie Haw, MD

## 2018-02-11 ENCOUNTER — Other Ambulatory Visit: Payer: Self-pay | Admitting: Hematology & Oncology

## 2018-02-11 DIAGNOSIS — C3491 Malignant neoplasm of unspecified part of right bronchus or lung: Secondary | ICD-10-CM

## 2018-02-14 ENCOUNTER — Other Ambulatory Visit: Payer: Self-pay | Admitting: Family

## 2018-02-14 DIAGNOSIS — C3491 Malignant neoplasm of unspecified part of right bronchus or lung: Secondary | ICD-10-CM

## 2018-02-14 DIAGNOSIS — W19XXXA Unspecified fall, initial encounter: Secondary | ICD-10-CM

## 2018-02-14 DIAGNOSIS — C7951 Secondary malignant neoplasm of bone: Secondary | ICD-10-CM

## 2018-02-16 ENCOUNTER — Other Ambulatory Visit: Payer: Self-pay | Admitting: *Deleted

## 2018-02-16 MED ORDER — METHADONE HCL 10 MG PO TABS: 10.0000 mg | ORAL_TABLET | Freq: Three times a day (TID) | ORAL | 0 refills | Status: DC

## 2018-02-16 MED ORDER — FUROSEMIDE 20 MG PO TABS: 20.0000 mg | ORAL_TABLET | Freq: Every day | ORAL | 0 refills | Status: AC

## 2018-02-17 ENCOUNTER — Encounter (HOSPITAL_COMMUNITY)
Admission: RE | Admit: 2018-02-17 | Discharge: 2018-02-17 | Disposition: A | Discharge status: Home / Self Care | Payer: Medicare Other | Source: Ambulatory Visit | Attending: Family | Admitting: Family

## 2018-02-17 ENCOUNTER — Ambulatory Visit (HOSPITAL_COMMUNITY)
Admission: RE | Admit: 2018-02-17 | Discharge: 2018-02-17 | Disposition: A | Discharge status: Home / Self Care | Payer: Medicare Other | Source: Ambulatory Visit | Attending: Family | Admitting: Family

## 2018-02-17 DIAGNOSIS — W19XXXA Unspecified fall, initial encounter: Secondary | ICD-10-CM

## 2018-02-17 DIAGNOSIS — S22089A Unspecified fracture of T11-T12 vertebra, initial encounter for closed fracture: Secondary | ICD-10-CM | POA: Diagnosis not present

## 2018-02-17 DIAGNOSIS — C3490 Malignant neoplasm of unspecified part of unspecified bronchus or lung: Secondary | ICD-10-CM | POA: Diagnosis not present

## 2018-02-17 DIAGNOSIS — S32030A Wedge compression fracture of third lumbar vertebra, initial encounter for closed fracture: Secondary | ICD-10-CM | POA: Diagnosis not present

## 2018-02-17 DIAGNOSIS — I700 Atherosclerosis of aorta: Secondary | ICD-10-CM | POA: Insufficient documentation

## 2018-02-17 DIAGNOSIS — S0990XA Unspecified injury of head, initial encounter: Secondary | ICD-10-CM | POA: Diagnosis not present

## 2018-02-17 DIAGNOSIS — C775 Secondary and unspecified malignant neoplasm of intrapelvic lymph nodes: Secondary | ICD-10-CM | POA: Insufficient documentation

## 2018-02-17 DIAGNOSIS — S32029A Unspecified fracture of second lumbar vertebra, initial encounter for closed fracture: Secondary | ICD-10-CM | POA: Insufficient documentation

## 2018-02-17 DIAGNOSIS — C3491 Malignant neoplasm of unspecified part of right bronchus or lung: Secondary | ICD-10-CM | POA: Insufficient documentation

## 2018-02-17 DIAGNOSIS — C7951 Secondary malignant neoplasm of bone: Secondary | ICD-10-CM

## 2018-02-17 DIAGNOSIS — C7989 Secondary malignant neoplasm of other specified sites: Secondary | ICD-10-CM | POA: Diagnosis not present

## 2018-02-17 DIAGNOSIS — C772 Secondary and unspecified malignant neoplasm of intra-abdominal lymph nodes: Secondary | ICD-10-CM | POA: Insufficient documentation

## 2018-02-17 DIAGNOSIS — S32019A Unspecified fracture of first lumbar vertebra, initial encounter for closed fracture: Secondary | ICD-10-CM | POA: Diagnosis not present

## 2018-02-17 DIAGNOSIS — S32039A Unspecified fracture of third lumbar vertebra, initial encounter for closed fracture: Secondary | ICD-10-CM | POA: Diagnosis not present

## 2018-02-17 DIAGNOSIS — Z79899 Other long term (current) drug therapy: Secondary | ICD-10-CM | POA: Diagnosis not present

## 2018-02-17 DIAGNOSIS — C786 Secondary malignant neoplasm of retroperitoneum and peritoneum: Secondary | ICD-10-CM | POA: Diagnosis not present

## 2018-02-17 DIAGNOSIS — I517 Cardiomegaly: Secondary | ICD-10-CM | POA: Diagnosis not present

## 2018-02-17 DIAGNOSIS — X58XXXA Exposure to other specified factors, initial encounter: Secondary | ICD-10-CM | POA: Diagnosis not present

## 2018-02-17 DIAGNOSIS — R222 Localized swelling, mass and lump, trunk: Secondary | ICD-10-CM | POA: Insufficient documentation

## 2018-02-17 DIAGNOSIS — S22080A Wedge compression fracture of T11-T12 vertebra, initial encounter for closed fracture: Secondary | ICD-10-CM | POA: Diagnosis not present

## 2018-02-17 DIAGNOSIS — S32020A Wedge compression fracture of second lumbar vertebra, initial encounter for closed fracture: Secondary | ICD-10-CM | POA: Diagnosis not present

## 2018-02-17 LAB — GLUCOSE, CAPILLARY: GLUCOSE-CAPILLARY: 101 mg/dL — AB (ref 65–99)

## 2018-02-17 MED ORDER — FLUDEOXYGLUCOSE F - 18 (FDG) INJECTION
8.4000 | Freq: Once | INTRAVENOUS | Status: AC | PRN
  Administered 2018-02-17: 8.4 via INTRAVENOUS

## 2018-02-17 MED ORDER — GADOBENATE DIMEGLUMINE 529 MG/ML IV SOLN
20.0000 mL | Freq: Once | INTRAVENOUS | Status: AC | PRN
  Administered 2018-02-17: 16 mL via INTRAVENOUS

## 2018-02-23 ENCOUNTER — Other Ambulatory Visit: Payer: Self-pay | Admitting: *Deleted

## 2018-02-23 MED ORDER — METHADONE HCL 5 MG PO TABS: 5.0000 mg | ORAL_TABLET | Freq: Two times a day (BID) | ORAL | 0 refills | Status: DC

## 2018-02-23 MED ORDER — METHADONE HCL 5 MG PO TABS: 10.0000 mg | ORAL_TABLET | Freq: Two times a day (BID) | ORAL | 0 refills | Status: DC

## 2018-02-23 MED ORDER — OXYCODONE HCL 5 MG PO TABS
5.0000 mg | ORAL_TABLET | Freq: Four times a day (QID) | ORAL | 0 refills | Status: AC | PRN
Start: 2018-02-23 — End: ?

## 2018-02-23 MED ORDER — MORPHINE SULFATE (CONCENTRATE) 20 MG/ML PO SOLN: 10.0000 mg | ORAL | 0 refills | Status: AC | PRN

## 2018-02-28 ENCOUNTER — Telehealth: Payer: Self-pay | Admitting: *Deleted

## 2018-02-28 NOTE — Telephone Encounter (Signed)
Received call from [place] at [facility] stating that patient has been having 2 plus pitting edema in ankles/feet which has been known.  Family wrapped these in cotton gauze to weeping areas and the cotton collected in the areas creating "pus pockets".  Nurse afraid the patient may get cellulitis in her legs.  Dr. Marin Olp notified. Ordered Doxycline 100 mg bid.  Order given to Hospice nurse.

## 2018-03-01 ENCOUNTER — Inpatient Hospital Stay: Payer: PPO | Admitting: Hematology & Oncology

## 2018-03-01 ENCOUNTER — Inpatient Hospital Stay: Payer: PPO

## 2018-03-02 ENCOUNTER — Other Ambulatory Visit: Payer: PPO

## 2018-03-02 ENCOUNTER — Ambulatory Visit: Payer: PPO | Admitting: Hematology & Oncology

## 2018-03-11 ENCOUNTER — Other Ambulatory Visit: Payer: Self-pay | Admitting: *Deleted

## 2018-03-11 MED ORDER — METHADONE HCL 10 MG PO TABS: 10.0000 mg | ORAL_TABLET | Freq: Two times a day (BID) | ORAL | 0 refills | Status: AC

## 2018-03-28 ENCOUNTER — Encounter: Payer: Self-pay | Admitting: *Deleted

## 2018-03-28 NOTE — Progress Notes (Signed)
Received Fax from [place] that patient has passed away.  No date or time noted. Dr. Marin Olp made aware.

## 2018-04-18 DEATH — deceased

## 2018-11-27 IMAGING — MR MR LUMBAR SPINE WO/W CM
4 of 8 series · 18 of 48 positions shown · IV contrast (Yes)
Comparison: Lumbar MRI 11/27/2017

CLINICAL DATA: Metastatic lung cancer

EXAM:
MRI LUMBAR SPINE WITHOUT AND WITH CONTRAST
TECHNIQUE: Multiplanar and multiecho pulse sequences of the lumbar spine were
obtained without and with intravenous contrast.
CONTRAST:  16mL MULTIHANCE GADOBENATE DIMEGLUMINE 529 MG/ML IV SOLN

[Series 13: T1 · sagittal · 4.0mm · 0.51mm/px · 3 of 16 slices shown (1 of 2)]
[im 1/16]
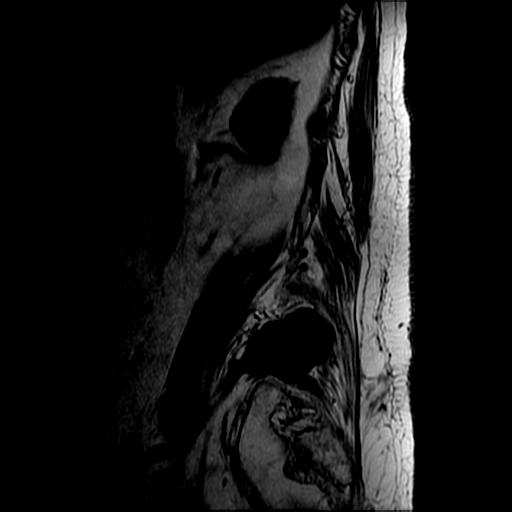
[im 8/16]
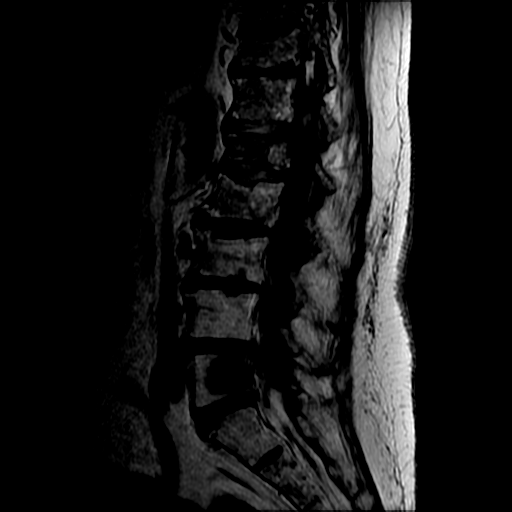
[im 16/16]
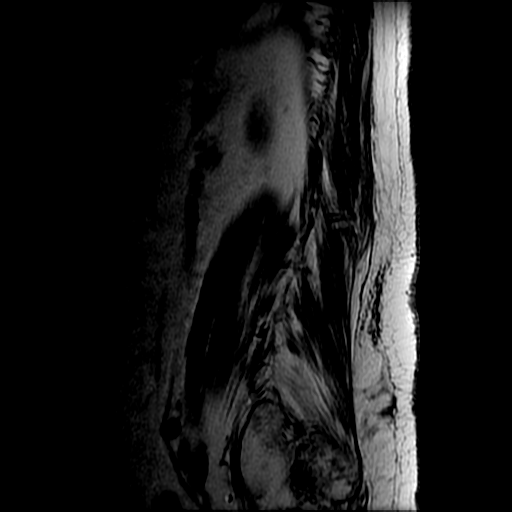

[Series 15: T2 · axial · 4.0mm · 0.41mm/px · z∈[-560,-353]mm · 9 of 42 slices shown]
[im 1/42]
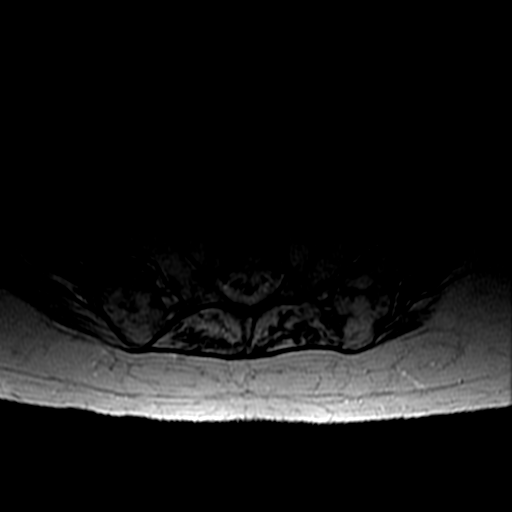
[im 6/42]
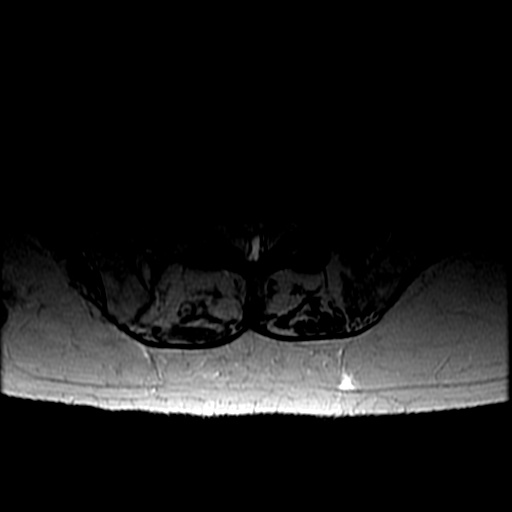
[im 11/42]
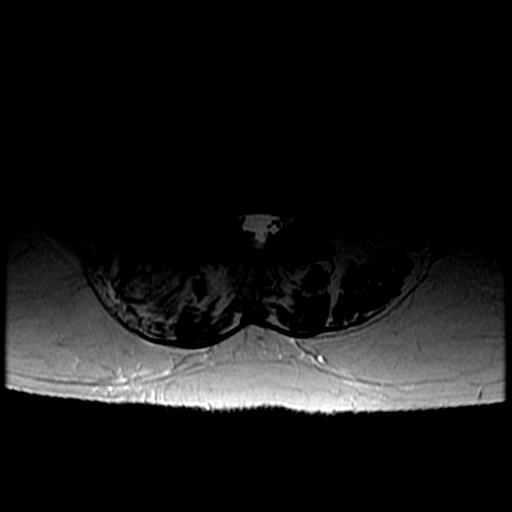
[im 16/42]
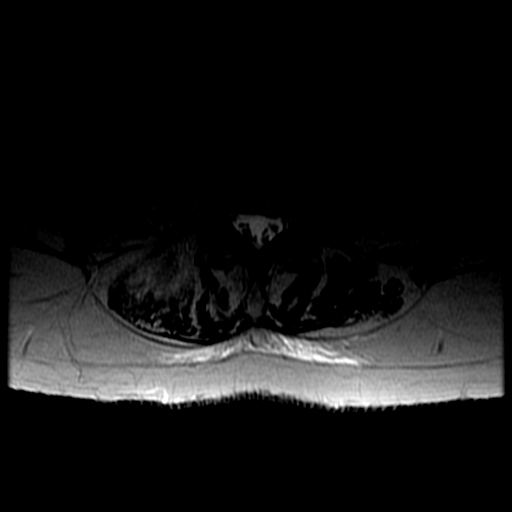
[im 21/42]
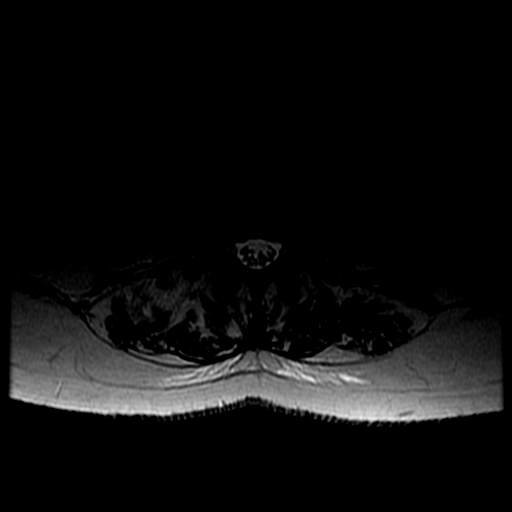
[im 26/42]
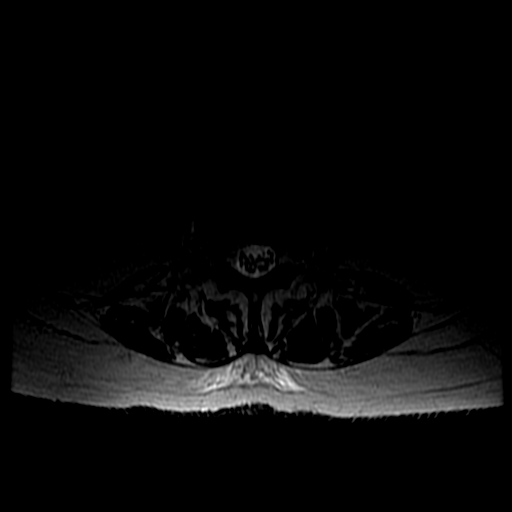
[im 31/42]
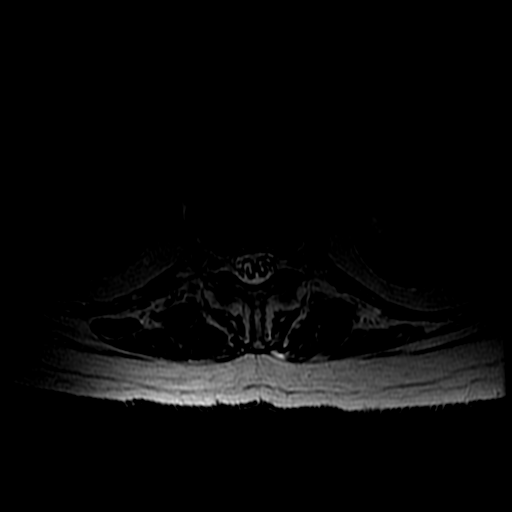
[im 36/42]
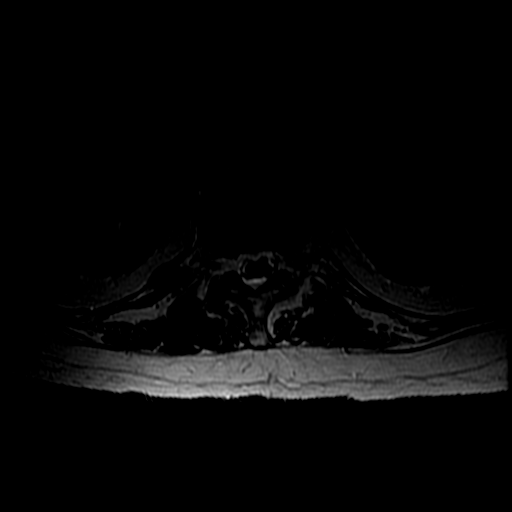
[im 42/42]
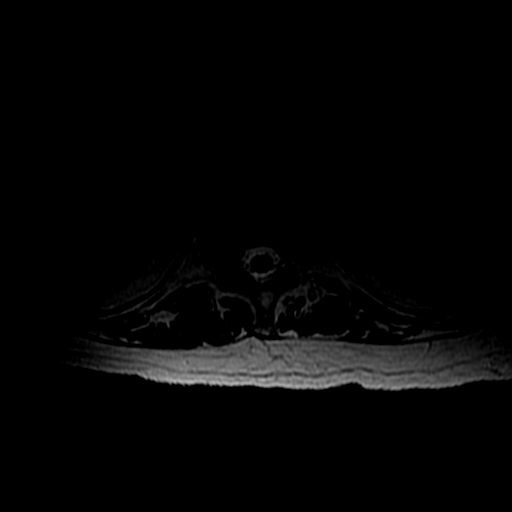

[Series 16: T1 · axial · 4.0mm · 0.41mm/px · z∈[-535,-382]mm · 3 of 42 slices shown (2 of 2)]
[im 6/42]
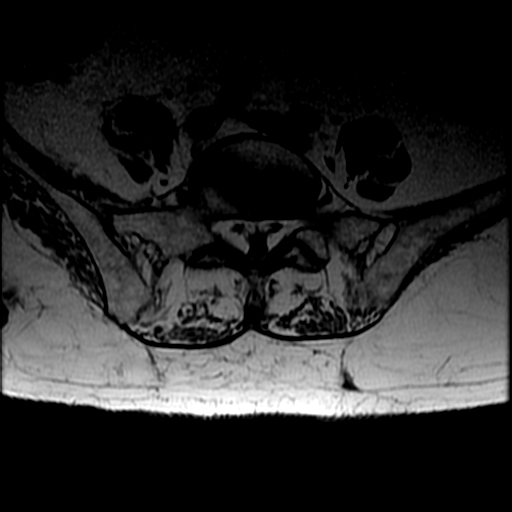
[im 21/42]
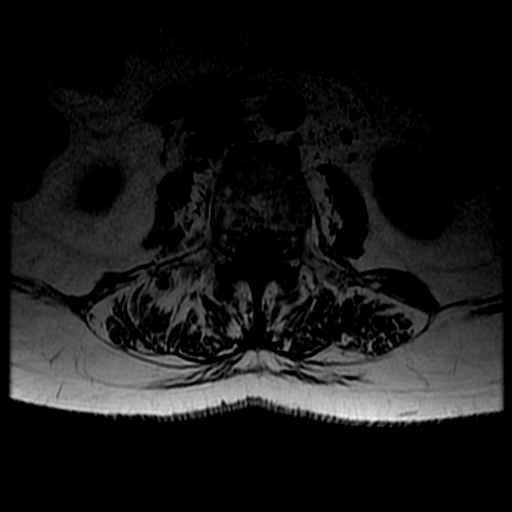
[im 36/42]
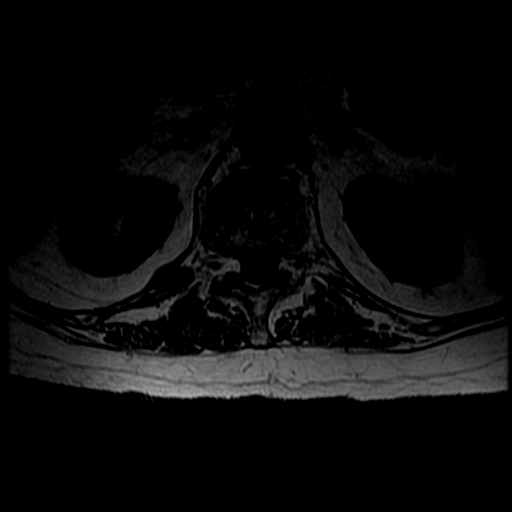

[Series 18: T2 post-contrast · sagittal · 4.0mm · 0.51mm/px · 3 of 16 slices shown]
[im 1/16]
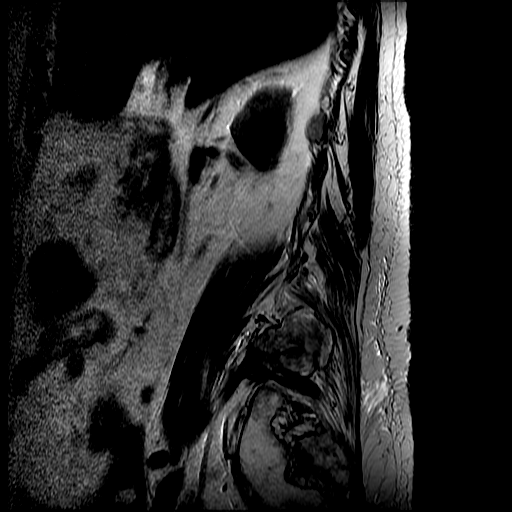
[im 11/16]
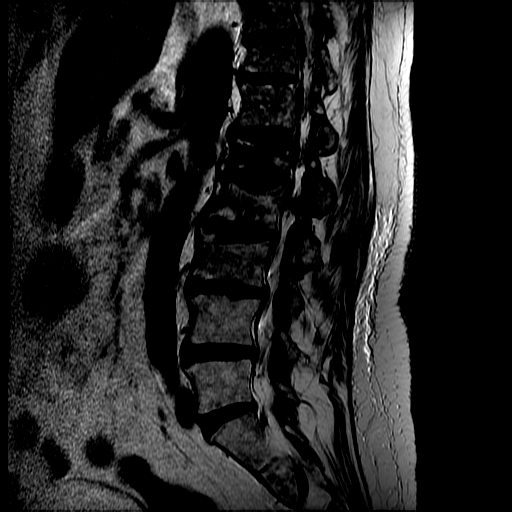
[im 16/16]
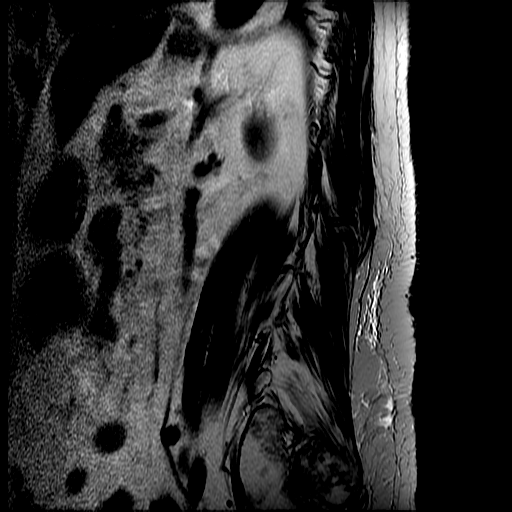

[18 of 48 positions shown; findings below may reference images not displayed]

FINDINGS: Segmentation:  Normal

Alignment:  Normal

Vertebrae: Mild compression fracture of T11 has occurred since the
prior study. Slight superior endplate edema compatible with healing
fracture. No definite tumor present

Moderate compression fracture of L1 has developed since the prior
study with bone marrow enhancement. This appears to be a pathologic
fracture with enhancing mass lesion in the posterior vertebral body
which is not identified previously. No significant epidural tumor or
retropulsion into the canal.

Mild compression fractures at L2 and L3 were not present previously.
No significant bone marrow edema or tumor in these vertebral bodies.

Extensive tumor in the L5 vertebral body on the right extending into
the L4-5 and L5-S1 neural foramina and into the right paraspinous
muscle. Tumor also is in the right L4 pedicle. Bony tumor is similar
in extent to the prior study however there is progression of tumor
in the muscle on the right. This muscle mass now measures 41 x 43
mm.

Conus medullaris and cauda equina: Conus extends to the L1-2 level.
Conus and cauda equina appear normal.

Paraspinal and other soft tissues: Retrocrural lymph nodes on the
right at T11 measuring 1 cm show enhancement and are consistent with
tumor. These are not visualized previously.

Progression of right erector spinae muscle mass at the L4-5 level.
This shows heterogeneous enhancement compatible with tumor and
measures 41 x 43 mm.

Enhancing nodules in the subcutaneous fat of the right buttock
posteriorly compatible with tumor. This may be tumor seeding related
to prior needle biopsy on 12/13/2017

Disc levels:

L1-2: Disc and facet degeneration without significant stenosis

L2-3: Mild disc and mild facet degeneration

L3-4: Mild disc and mild facet degeneration

L4-5: Mild disc and mild facet degeneration. Right foraminal
encroachment due to tumor unchanged from the prior study.

L5-S1: Right foraminal encroachment due to tumor involvement
unchanged from the prior study.
IMPRESSION: Moderate compression fracture of L1 which appears to be pathologic
has developed since the prior MRI.

Mild compression fractures of T11, L2, and L3 are new since the
prior MRI but do not appear to be pathologic. Minimal associated
bone marrow edema at the T11 fracture.

Tumor in the right L5 vertebral body and pedicle is similar.
Progression of tumor extending into the erector spinae muscle on the
right.

New retrocrural lymph nodes on the right at T11. Enhancing nodules
in the subcutaneous fat of the right buttock are new and likely
represents tumor related to prior needle biopsy.

## 2018-11-27 IMAGING — MR MR HEAD WO/W CM
11 of 13 series · 35 of 48 positions shown · IV contrast (multihance)
Comparison: None.

CLINICAL DATA: Adenocarcinoma lung. Metastatic cancer to spine.
Fall

EXAM:
MRI HEAD WITHOUT AND WITH CONTRAST
TECHNIQUE: Multiplanar, multiecho pulse sequences of the brain and surrounding
structures were obtained without and with intravenous contrast.
CONTRAST:  16mL MULTIHANCE GADOBENATE DIMEGLUMINE 529 MG/ML IV SOLN

[Series 3: DWI · axial · 3.0mm · 1.09mm/px · z∈[-20,+126]mm · 8 of 102 slices shown (1 of 4)]
[im 1/102]
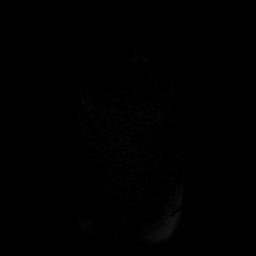
[im 12/102]
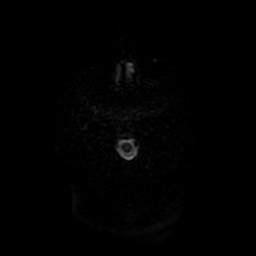
[im 34/102]
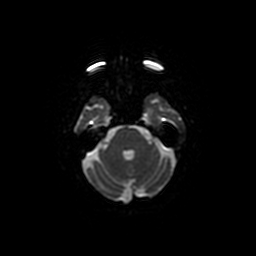
[im 45/102]
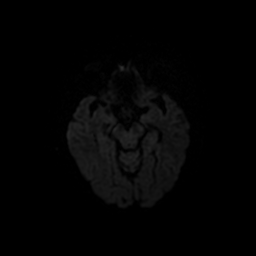
[im 57/102]
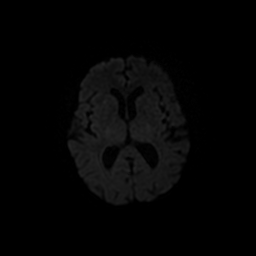
[im 68/102]
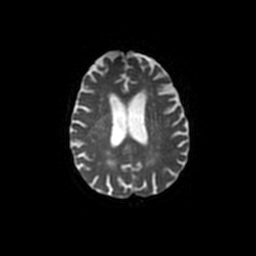
[im 90/102]
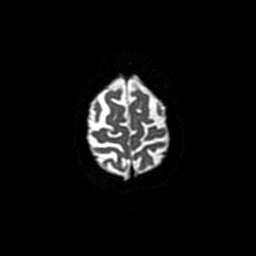
[im 102/102]
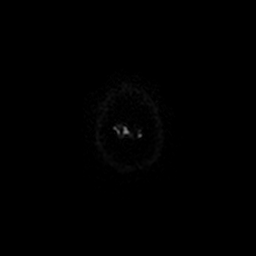

[Series 4: T1 · sagittal · 5.0mm · 0.47mm/px · 2 of 24 slices shown]
[im 1/24]
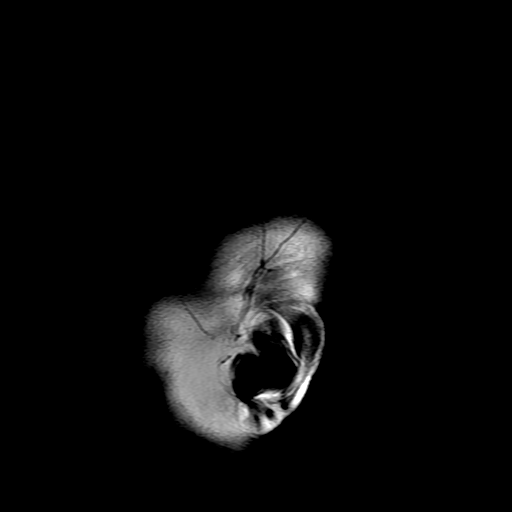
[im 24/24]
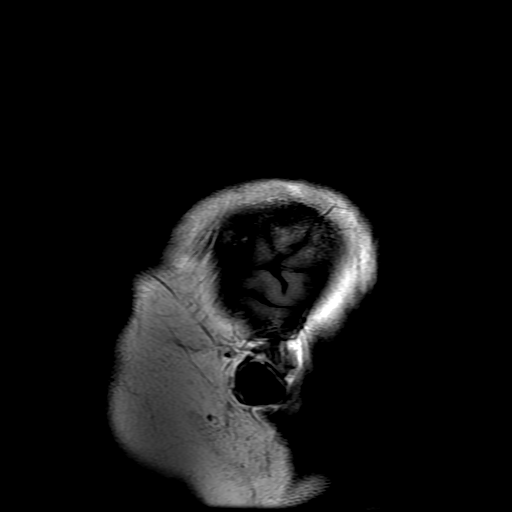

[Series 5: DWI · coronal · 5.0mm · 1.09mm/px · 6 of 62 slices shown (2 of 4)]
[im 1/62]
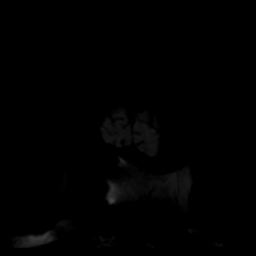
[im 13/62]
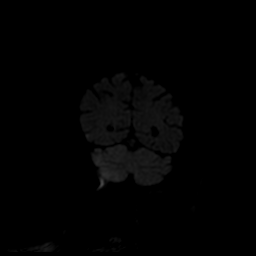
[im 25/62]
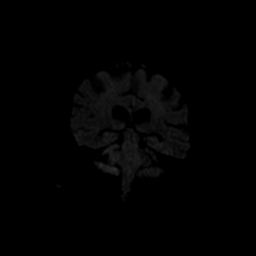
[im 37/62]
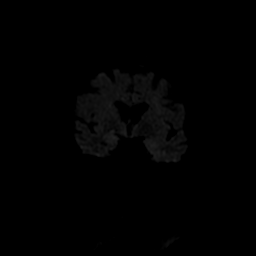
[im 49/62]
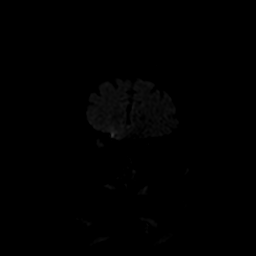
[im 62/62]
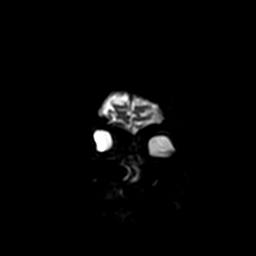

[Series 6: T2 · axial · 5.0mm · 0.47mm/px · z∈[-32,+125]mm · 2 of 24 slices shown (1 of 2)]
[im 1/24]
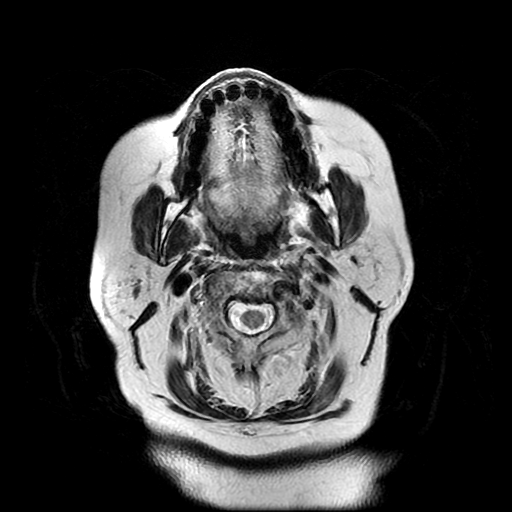
[im 24/24]
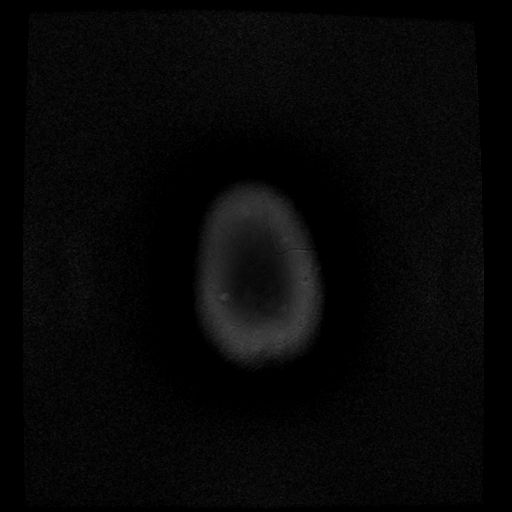

[Series 7: FLAIR · axial · 3.0mm · 0.43mm/px · z∈[-21,+123]mm · 2 of 26 slices shown]
[im 1/26]
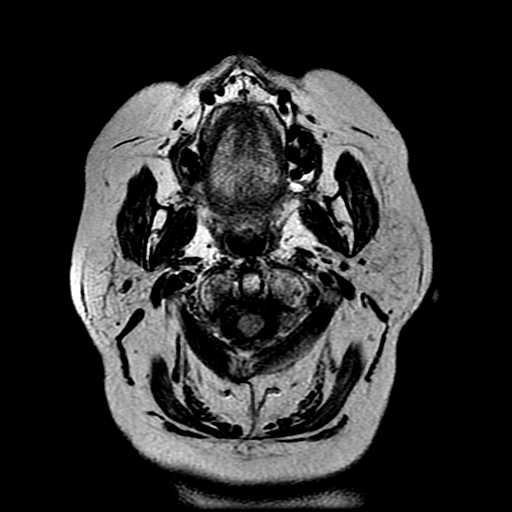
[im 26/26]
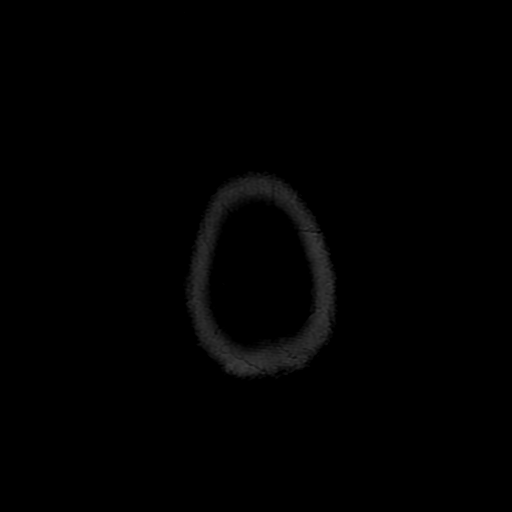

[Series 8: ax mpgr · axial · 5.0mm · 0.47mm/px · 1 of 24 slices shown]
[im 1/24]
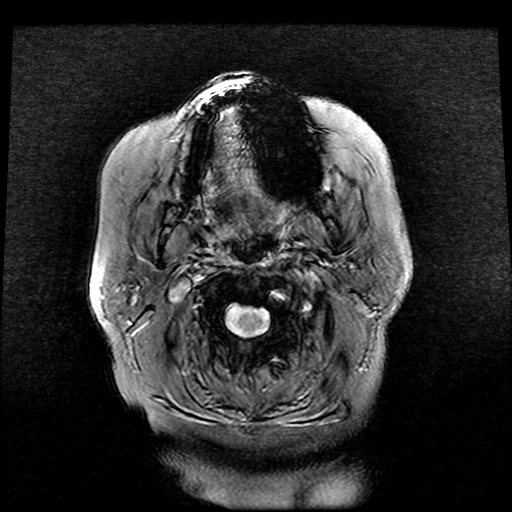

[Series 10: T2 · coronal · 5.0mm · 0.47mm/px · 2 of 24 slices shown (2 of 2)]
[im 1/24]
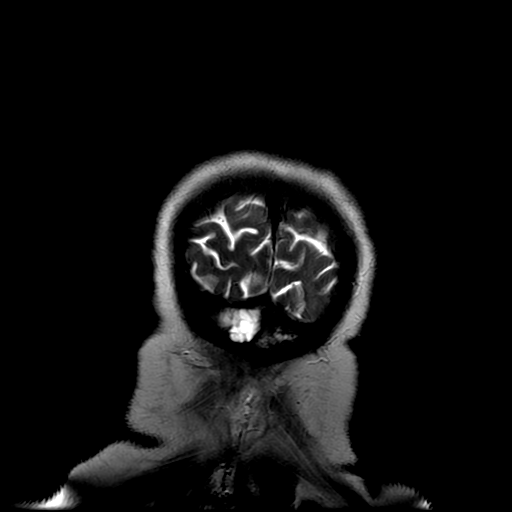
[im 24/24]
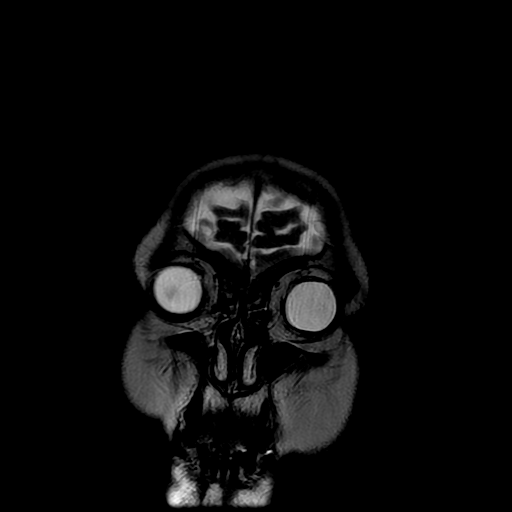

[Series 23: T1 post-contrast · coronal · 5.0mm · 0.47mm/px · 2 of 24 slices shown (1 of 2)]
[im 1/24]
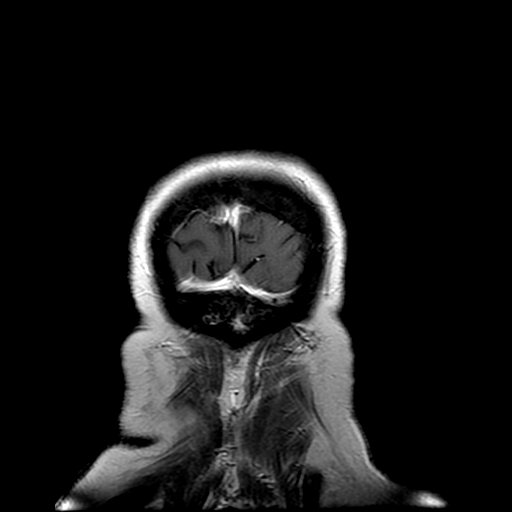
[im 24/24]
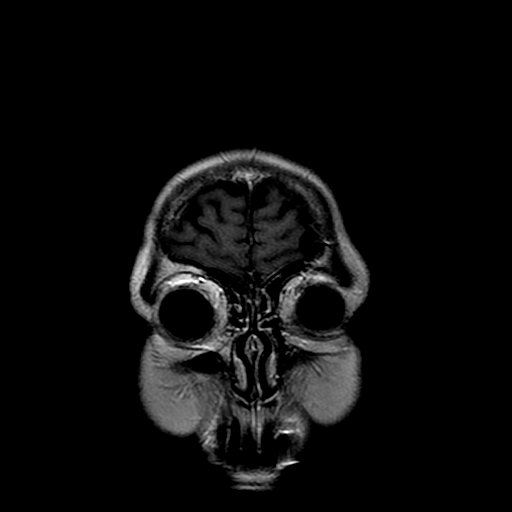

[Series 24: T1 post-contrast · sagittal · 5.0mm · 0.47mm/px · 2 of 24 slices shown (2 of 2)]
[im 1/24]
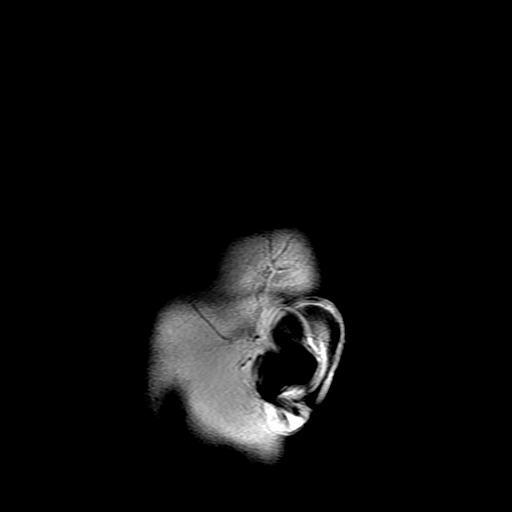
[im 24/24]
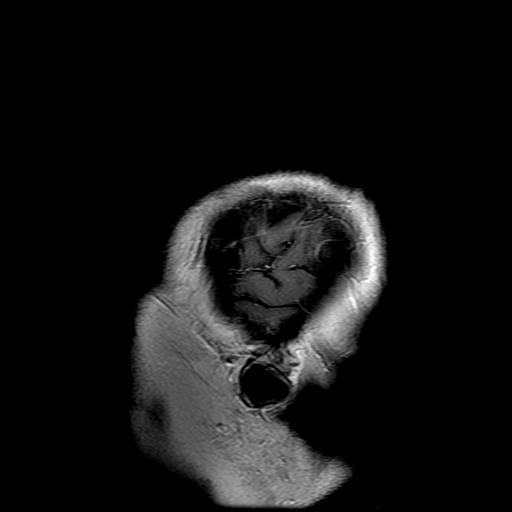

[Series 300: DWI · axial · 3.0mm · 1.09mm/px · z∈[-20,+126]mm · 5 of 51 slices shown (3 of 4)]
[im 1/51]
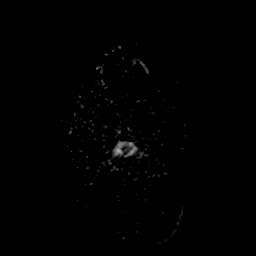
[im 13/51]
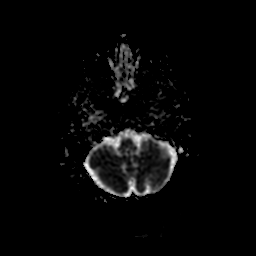
[im 26/51]
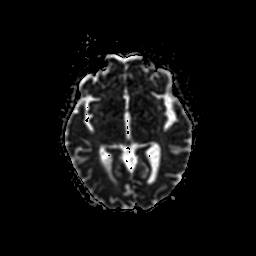
[im 38/51]
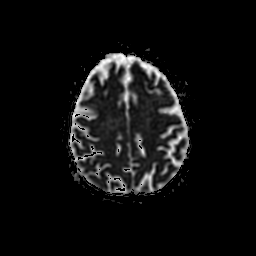
[im 51/51]
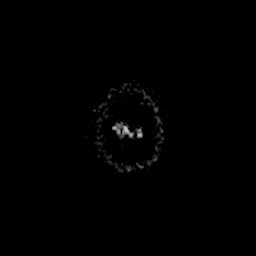

[Series 500: DWI · coronal · 5.0mm · 1.09mm/px · 3 of 31 slices shown (4 of 4)]
[im 1/31]
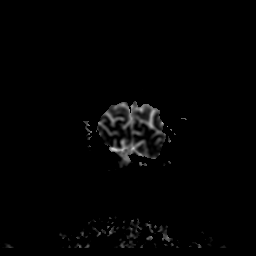
[im 16/31]
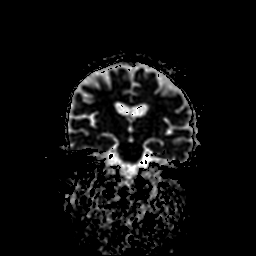
[im 31/31]
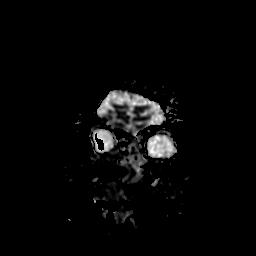

[35 of 48 positions shown; findings below may reference images not displayed]

FINDINGS: Brain: Mild atrophy. Multiple small nonenhancing white matter
hyperintensities bilaterally. Negative for acute infarct. Negative
for hemorrhage or mass

Normal enhancement postcontrast infusion. No enhancing metastatic
deposits.

Vascular: Normal arterial flow voids

Skull and upper cervical spine: Negative

Sinuses/Orbits: Mild mucosal edema paranasal sinuses.  Normal orbit

Other: None
IMPRESSION: Negative for metastatic disease to the brain

Chronic microvascular ischemic changes in the white matter.

## 2018-11-27 IMAGING — CT NM PET TUM IMG RESTAG (PS) SKULL BASE T - THIGH
1 of 7 series · 1 of 25 positions shown · non-contrast
Comparison: Multiple exams, including 12/10/2017 PET-CT

CLINICAL DATA: Subsequent treatment strategy for metastatic
non-small cell lung cancer.

EXAM:
NUCLEAR MEDICINE PET SKULL BASE TO THIGH
TECHNIQUE: 8.4 mCi F-18 FDG was injected intravenously. Full-ring PET imaging
was performed from the skull base to thigh after the radiotracer. CT
data was obtained and used for attenuation correction and anatomic
localization.
Fasting blood glucose: 101 mg/dl

[Series 4: ct sk_thigh 5.0 b31f · axial · 5.0mm · 0.86mm/px · 1 of 222 slices shown]
[im 222/222  brain]
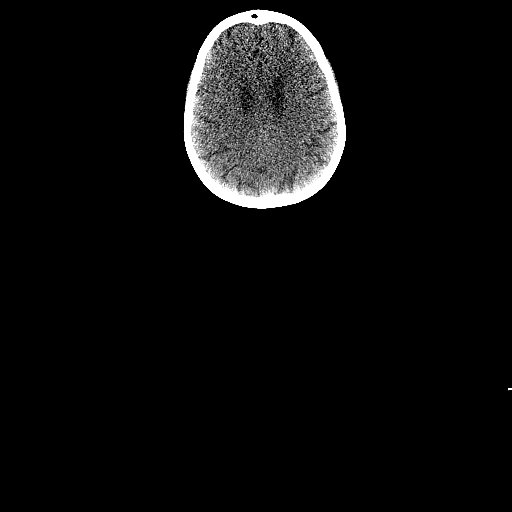

[1 of 25 positions shown; findings below may reference images not displayed]

FINDINGS: Mediastinal blood pool activity: SUV max

Despite efforts by the technologist and patient, motion artifact is
present on today's exam and could not be eliminated. This reduces
exam sensitivity and specificity.

NECK: No findings of abnormal hypermetabolic activity in the neck.

Incidental CT findings: none

CHEST: The right paramediastinal mass invades the mediastinum and
also has some early direct invasion of the right fifth vertebral
body, and measures 4.2 by 4.3 cm with maximum SUV 31.1 (previously
4.6 by 3.3 cm with maximum SUV 29.3). An adjacent malignant
paratracheal node measures 2.2 cm in short axis (formerly 2.3 cm)
with maximum SUV 33.7 (formerly 21.4). Tumor wraps around the right
upper lobe bronchus and extends along the margins the bronchus
intermedius. There is hypermetabolic right hilar adenopathy and
adenopathy extending between the SVC and right pulmonary artery.

7 by 6 mm right lower lobe subpleural nodule on image [DATE],
previously measured at 8 by 6 mm, not appreciably hypermetabolic.
Mild subpleural nodularity along the right major fissure is stable.
Pulmonary nodules medially in the right lower lobe are clustered,
with the largest measuring 1.1 by 0.8 cm, formerly 1.3 by 1.1 cm,
maximum SUV approximately 3.8 and previously 4.6.

Incidental CT findings: Mild cardiomegaly. Atherosclerotic
calcification of the aortic arch.

ABDOMEN/PELVIS: Retrocaval node 1.2 cm in short axis on image 115/4
with maximum SUV 44.2, new compared to the prior exam. Multiple new
hypermetabolic right common iliac, internal iliac, and external
iliac lymph nodes are present, with an index right external iliac
node measuring 1.8 cm in short axis on image 154/4 (previously not
present) and with maximum SUV

Incidental CT findings: Aortoiliac atherosclerotic vascular disease.
Prominent stool throughout the colon favors constipation.

SKELETON: There is activity within or along the left scapula, much
of which is probably muscular/physiologic.

Scattered small foci of new abnormal activity in the thoracic spine
compatible with metastatic lesions. The indistinct right paraspinal
tumor at the T11 level measuring about 2.8 by 1.0 cm with maximum
SUV 17.9, new compared to the prior exam. A new metastatic lesion
along the right posterior hemidiaphragm adjacent to the twelfth rib
measures 1.8 by 0.8 cm with maximum SUV 17.9. Small metastatic focus
along the posterolateral abdominal wall musculature just below the
twelfth rib, maximum SUV 10.3. The lytic mass involving the L5
vertebral body and adjacent paravertebral tissues is again noted
with maximum SUV 21.3, formerly 26.3 hypermetabolic nodularity in
the right buttock region with maximum SUV 19.2, new compared to the
prior exam. New metastatic focus along the right sciatic notch
adjacent to the piriformis muscle 1.1 by 0.8 cm on image 157/4,
maximum SUV 24.5.

Incidental CT findings: none
IMPRESSION: 1. Progressive metastatic disease including new retroperitoneal and
pelvic hypermetabolic adenopathy, and multiple new bony and soft
tissue lesions compared to the prior exam.
2. The original right paramediastinal mass is of roughly similar
size as is the adjacent adenopathy, with minimally increased
standard uptake values.
3. Other imaging findings of potential clinical significance: Aortic
Atherosclerosis ([id number]). Prominent stool throughout the colon
favors constipation. Mild cardiomegaly.
